# Patient Record
Sex: Male | Born: 1960 | Race: White | Hispanic: No | Smoking: Current every day smoker
Health system: Southern US, Community
[De-identification: ages and names within clinical notes are randomized; demographics above are authoritative.]

## PROBLEM LIST (undated history)

## (undated) DIAGNOSIS — Z789 Other specified health status: Secondary | ICD-10-CM

---

## 2001-12-26 ENCOUNTER — Encounter: Payer: Self-pay | Admitting: Emergency Medicine

## 2001-12-26 ENCOUNTER — Emergency Department (HOSPITAL_COMMUNITY): Admission: EM | Admit: 2001-12-26 | Discharge: 2001-12-26 | Payer: Self-pay

## 2008-01-14 ENCOUNTER — Emergency Department (HOSPITAL_COMMUNITY): Admission: EM | Admit: 2008-01-14 | Discharge: 2008-01-15 | Payer: Self-pay | Admitting: Emergency Medicine

## 2010-06-05 ENCOUNTER — Emergency Department (HOSPITAL_COMMUNITY): Admission: EM | Admit: 2010-06-05 | Discharge: 2010-06-05 | Payer: Self-pay | Admitting: Emergency Medicine

## 2010-10-26 LAB — COMPREHENSIVE METABOLIC PANEL
ALT: 17 U/L (ref 0–53)
AST: 27 U/L (ref 0–37)
Albumin: 4 g/dL (ref 3.5–5.2)
Alkaline Phosphatase: 62 U/L (ref 39–117)
BUN: 12 mg/dL (ref 6–23)
CO2: 22 mEq/L (ref 19–32)
Calcium: 9.2 mg/dL (ref 8.4–10.5)
Chloride: 105 mEq/L (ref 96–112)
Creatinine, Ser: 1.22 mg/dL (ref 0.4–1.5)
GFR calc Af Amer: >60 mL/min (ref >60)
GFR calc non Af Amer: >60 mL/min (ref >60)
Glucose, Bld: 148 mg/dL — ABNORMAL HIGH (ref 70–99)
Potassium: 4.1 mEq/L (ref 3.5–5.1)
Sodium: 138 mEq/L (ref 135–145)
Total Bilirubin: 0.9 mg/dL (ref 0.3–1.2)
Total Protein: 7.9 g/dL (ref 6.0–8.3)

## 2010-10-26 LAB — DIFFERENTIAL
Basophils Absolute: 0 10*3/uL (ref 0.0–0.1)
Basophils Relative: 0 % (ref 0–1)
Eosinophils Absolute: 0.1 10*3/uL (ref 0.0–0.7)
Eosinophils Relative: 0 % (ref 0–5)
Lymphocytes Relative: 11 % — ABNORMAL LOW (ref 12–46)
Lymphs Abs: 1.2 10*3/uL (ref 0.7–4.0)
Monocytes Absolute: 0.5 10*3/uL (ref 0.1–1.0)
Monocytes Relative: 4 % (ref 3–12)
Neutro Abs: 9.7 10*3/uL — ABNORMAL HIGH (ref 1.7–7.7)
Neutrophils Relative %: 85 % — ABNORMAL HIGH (ref 43–77)

## 2010-10-26 LAB — LIPASE, BLOOD: Lipase: 31 U/L (ref 11–59)

## 2010-10-26 LAB — CBC
HCT: 47.2 % (ref 39.0–52.0)
Hemoglobin: 16 g/dL (ref 13.0–17.0)
MCH: 30.3 pg (ref 26.0–34.0)
MCHC: 34 g/dL (ref 30.0–36.0)
MCV: 89.3 fL (ref 78.0–100.0)
Platelets: 227 10*3/uL (ref 150–400)
RBC: 5.29 MIL/uL (ref 4.22–5.81)
RDW: 14 % (ref 11.5–15.5)
WBC: 11.5 10*3/uL — ABNORMAL HIGH (ref 4.0–10.5)

## 2011-01-21 ENCOUNTER — Emergency Department (HOSPITAL_COMMUNITY)
Admission: EM | Admit: 2011-01-21 | Discharge: 2011-01-21 | Disposition: A | Discharge status: Home / Self Care | Payer: Self-pay | Attending: Emergency Medicine | Admitting: Emergency Medicine

## 2011-01-21 ENCOUNTER — Emergency Department (HOSPITAL_COMMUNITY): Payer: Self-pay

## 2011-01-21 DIAGNOSIS — R7309 Other abnormal glucose: Secondary | ICD-10-CM | POA: Insufficient documentation

## 2011-01-21 DIAGNOSIS — R6883 Chills (without fever): Secondary | ICD-10-CM | POA: Insufficient documentation

## 2011-01-21 DIAGNOSIS — R197 Diarrhea, unspecified: Secondary | ICD-10-CM | POA: Insufficient documentation

## 2011-01-21 DIAGNOSIS — R10819 Abdominal tenderness, unspecified site: Secondary | ICD-10-CM | POA: Insufficient documentation

## 2011-01-21 DIAGNOSIS — R1013 Epigastric pain: Secondary | ICD-10-CM | POA: Insufficient documentation

## 2011-01-21 DIAGNOSIS — R112 Nausea with vomiting, unspecified: Secondary | ICD-10-CM | POA: Insufficient documentation

## 2011-01-21 LAB — COMPREHENSIVE METABOLIC PANEL
ALT: 11 U/L (ref 0–53)
AST: 22 U/L (ref 0–37)
Alkaline Phosphatase: 64 U/L (ref 39–117)
CO2: 26 mEq/L (ref 19–32)
GFR calc Af Amer: >60 mL/min (ref >60)
GFR calc non Af Amer: >60 mL/min (ref >60)
Glucose, Bld: 171 mg/dL — ABNORMAL HIGH (ref 70–99)
Potassium: 4.2 mEq/L (ref 3.5–5.1)
Sodium: 139 mEq/L (ref 135–145)

## 2011-01-21 LAB — URINE MICROSCOPIC-ADD ON

## 2011-01-21 LAB — URINALYSIS, ROUTINE W REFLEX MICROSCOPIC
Bilirubin Urine: NEGATIVE
Glucose, UA: NEGATIVE mg/dL
Hgb urine dipstick: NEGATIVE
Protein, ur: 30 mg/dL — AB
Specific Gravity, Urine: 1.02 (ref 1.005–1.030)
Urobilinogen, UA: 0.2 mg/dL (ref 0.0–1.0)

## 2011-01-21 LAB — DIFFERENTIAL
Basophils Absolute: 0.1 10*3/uL (ref 0.0–0.1)
Basophils Relative: 0 % (ref 0–1)
Eosinophils Absolute: 0 10*3/uL (ref 0.0–0.7)
Neutro Abs: 11.4 10*3/uL — ABNORMAL HIGH (ref 1.7–7.7)
Neutrophils Relative %: 87 % — ABNORMAL HIGH (ref 43–77)

## 2011-01-21 LAB — CBC
Hemoglobin: 15 g/dL (ref 13.0–17.0)
Platelets: 210 10*3/uL (ref 150–400)
RBC: 4.97 MIL/uL (ref 4.22–5.81)
WBC: 13 10*3/uL — ABNORMAL HIGH (ref 4.0–10.5)

## 2011-05-11 LAB — CBC
HCT: 41.4
Hemoglobin: 13.8
RBC: 4.73
RDW: 13.4

## 2011-05-11 LAB — DIFFERENTIAL
Basophils Absolute: 0
Basophils Relative: 1
Eosinophils Absolute: 0
Neutro Abs: 4
Neutrophils Relative %: 65

## 2011-05-11 LAB — POCT CARDIAC MARKERS
Myoglobin, poc: 233
Operator id: 294511

## 2011-05-11 LAB — COMPREHENSIVE METABOLIC PANEL
ALT: 21
Alkaline Phosphatase: 44
BUN: 22
CO2: 24
GFR calc non Af Amer: 52 — ABNORMAL LOW
Glucose, Bld: 103 — ABNORMAL HIGH
Potassium: 3.1 — ABNORMAL LOW
Sodium: 132 — ABNORMAL LOW
Total Bilirubin: 0.7

## 2011-05-11 LAB — LIPASE, BLOOD: Lipase: 20

## 2011-05-11 LAB — ETHANOL: Alcohol, Ethyl (B): <5

## 2012-08-14 HISTORY — PX: CHEST TUBE INSERTION: SHX231

## 2013-01-26 ENCOUNTER — Inpatient Hospital Stay (HOSPITAL_COMMUNITY)
Admission: EM | Admit: 2013-01-26 | Discharge: 2013-01-30 | DRG: 200 | Disposition: A | Discharge status: Home / Self Care | Payer: MEDICAID | Attending: General Surgery | Admitting: General Surgery

## 2013-01-26 ENCOUNTER — Observation Stay (HOSPITAL_COMMUNITY): Payer: Self-pay

## 2013-01-26 ENCOUNTER — Emergency Department (HOSPITAL_COMMUNITY): Payer: Self-pay

## 2013-01-26 ENCOUNTER — Encounter (HOSPITAL_COMMUNITY): Payer: Self-pay | Admitting: Emergency Medicine

## 2013-01-26 DIAGNOSIS — S2242XA Multiple fractures of ribs, left side, initial encounter for closed fracture: Secondary | ICD-10-CM

## 2013-01-26 DIAGNOSIS — S270XXA Traumatic pneumothorax, initial encounter: Secondary | ICD-10-CM

## 2013-01-26 DIAGNOSIS — J939 Pneumothorax, unspecified: Secondary | ICD-10-CM

## 2013-01-26 DIAGNOSIS — S2249XA Multiple fractures of ribs, unspecified side, initial encounter for closed fracture: Secondary | ICD-10-CM | POA: Diagnosis present

## 2013-01-26 DIAGNOSIS — S42109A Fracture of unspecified part of scapula, unspecified shoulder, initial encounter for closed fracture: Secondary | ICD-10-CM | POA: Diagnosis present

## 2013-01-26 DIAGNOSIS — IMO0002 Reserved for concepts with insufficient information to code with codable children: Secondary | ICD-10-CM | POA: Diagnosis present

## 2013-01-26 DIAGNOSIS — J95811 Postprocedural pneumothorax: Principal | ICD-10-CM | POA: Diagnosis present

## 2013-01-26 DIAGNOSIS — F1020 Alcohol dependence, uncomplicated: Secondary | ICD-10-CM | POA: Diagnosis present

## 2013-01-26 DIAGNOSIS — S2232XA Fracture of one rib, left side, initial encounter for closed fracture: Secondary | ICD-10-CM

## 2013-01-26 DIAGNOSIS — T07XXXA Unspecified multiple injuries, initial encounter: Secondary | ICD-10-CM | POA: Diagnosis present

## 2013-01-26 DIAGNOSIS — S42112A Displaced fracture of body of scapula, left shoulder, initial encounter for closed fracture: Secondary | ICD-10-CM

## 2013-01-26 DIAGNOSIS — T148XXA Other injury of unspecified body region, initial encounter: Secondary | ICD-10-CM

## 2013-01-26 LAB — BASIC METABOLIC PANEL
CO2: 24 mEq/L (ref 19–32)
Calcium: 8.9 mg/dL (ref 8.4–10.5)
Creatinine, Ser: 0.96 mg/dL (ref 0.50–1.35)
GFR calc non Af Amer: >90 mL/min (ref >90)
Glucose, Bld: 122 mg/dL — ABNORMAL HIGH (ref 70–99)

## 2013-01-26 LAB — URINALYSIS, ROUTINE W REFLEX MICROSCOPIC
Glucose, UA: NEGATIVE mg/dL
Ketones, ur: NEGATIVE mg/dL
Protein, ur: NEGATIVE mg/dL
Urobilinogen, UA: 0.2 mg/dL (ref 0.0–1.0)

## 2013-01-26 LAB — CBC WITH DIFFERENTIAL/PLATELET
Basophils Absolute: 0.1 10*3/uL (ref 0.0–0.1)
Eosinophils Absolute: 0.9 10*3/uL — ABNORMAL HIGH (ref 0.0–0.7)
Eosinophils Relative: 4 % (ref 0–5)
HCT: 44.8 % (ref 39.0–52.0)
Lymphocytes Relative: 13 % (ref 12–46)
MCH: 30.6 pg (ref 26.0–34.0)
MCV: 88.4 fL (ref 78.0–100.0)
Monocytes Absolute: 1.2 10*3/uL — ABNORMAL HIGH (ref 0.1–1.0)
Platelets: 251 10*3/uL (ref 150–400)
RDW: 13.5 % (ref 11.5–15.5)
WBC: 23.9 10*3/uL — ABNORMAL HIGH (ref 4.0–10.5)

## 2013-01-26 MED ORDER — MORPHINE SULFATE 2 MG/ML IJ SOLN
INTRAMUSCULAR | Status: AC
  Administered 2013-01-26: 2 mg via INTRAMUSCULAR
  Filled 2013-01-26: qty 1

## 2013-01-26 MED ORDER — ONDANSETRON HCL 4 MG PO TABS: 4.0000 mg | ORAL_TABLET | Freq: Four times a day (QID) | ORAL | Status: DC | PRN

## 2013-01-26 MED ORDER — LORAZEPAM 1 MG PO TABS
1.0000 mg | ORAL_TABLET | Freq: Four times a day (QID) | ORAL | Status: AC | PRN
  Administered 2013-01-27 – 2013-01-29 (×3): 1 mg via ORAL
  Filled 2013-01-26 (×3): qty 1

## 2013-01-26 MED ORDER — FOLIC ACID 1 MG PO TABS
1.0000 mg | ORAL_TABLET | Freq: Every day | ORAL | Status: DC
  Administered 2013-01-26 – 2013-01-30 (×5): 1 mg via ORAL
  Filled 2013-01-26 (×5): qty 1

## 2013-01-26 MED ORDER — SODIUM CHLORIDE 0.9 % IV SOLN
INTRAVENOUS | Status: DC
  Administered 2013-01-26: 16:00:00 via INTRAVENOUS

## 2013-01-26 MED ORDER — LORAZEPAM 2 MG/ML IJ SOLN
1.0000 mg | Freq: Four times a day (QID) | INTRAMUSCULAR | Status: AC | PRN
  Administered 2013-01-26: 1 mg via INTRAVENOUS
  Filled 2013-01-26: qty 1

## 2013-01-26 MED ORDER — KCL IN DEXTROSE-NACL 20-5-0.45 MEQ/L-%-% IV SOLN
INTRAVENOUS | Status: DC
  Administered 2013-01-26: 23:00:00 via INTRAVENOUS
  Filled 2013-01-26 (×3): qty 1000

## 2013-01-26 MED ORDER — MORPHINE SULFATE 4 MG/ML IJ SOLN
4.0000 mg | Freq: Once | INTRAMUSCULAR | Status: AC
  Administered 2013-01-26: 4 mg via INTRAVENOUS
  Filled 2013-01-26: qty 1

## 2013-01-26 MED ORDER — MIDAZOLAM HCL 2 MG/2ML IJ SOLN
2.0000 mg | Freq: Once | INTRAMUSCULAR | Status: AC
  Administered 2013-01-26: 2 mg via INTRAVENOUS

## 2013-01-26 MED ORDER — ONDANSETRON HCL 4 MG/2ML IJ SOLN
4.0000 mg | Freq: Once | INTRAMUSCULAR | Status: AC
  Administered 2013-01-26: 4 mg via INTRAVENOUS
  Filled 2013-01-26: qty 2

## 2013-01-26 MED ORDER — VITAMIN B-1 100 MG PO TABS
100.0000 mg | ORAL_TABLET | Freq: Every day | ORAL | Status: DC
  Administered 2013-01-26 – 2013-01-30 (×5): 100 mg via ORAL
  Filled 2013-01-26 (×5): qty 1

## 2013-01-26 MED ORDER — FENTANYL CITRATE 0.05 MG/ML IJ SOLN
INTRAMUSCULAR | Status: AC | PRN
  Administered 2013-01-26: 25 ug via INTRAVENOUS

## 2013-01-26 MED ORDER — IOHEXOL 300 MG/ML  SOLN
100.0000 mL | Freq: Once | INTRAMUSCULAR | Status: AC | PRN
  Administered 2013-01-26: 100 mL via INTRAVENOUS

## 2013-01-26 MED ORDER — PANTOPRAZOLE SODIUM 40 MG PO TBEC: 40.0000 mg | DELAYED_RELEASE_TABLET | Freq: Every day | ORAL | Status: DC

## 2013-01-26 MED ORDER — THIAMINE HCL 100 MG/ML IJ SOLN
100.0000 mg | Freq: Every day | INTRAMUSCULAR | Status: DC
  Filled 2013-01-26 (×2): qty 1

## 2013-01-26 MED ORDER — PANTOPRAZOLE SODIUM 40 MG IV SOLR
40.0000 mg | Freq: Every day | INTRAVENOUS | Status: DC
  Administered 2013-01-26: 40 mg via INTRAVENOUS
  Filled 2013-01-26 (×2): qty 40

## 2013-01-26 MED ORDER — HEPARIN SODIUM (PORCINE) 5000 UNIT/ML IJ SOLN
5000.0000 [IU] | Freq: Three times a day (TID) | INTRAMUSCULAR | Status: DC
  Administered 2013-01-26 – 2013-01-27 (×2): 5000 [IU] via SUBCUTANEOUS
  Filled 2013-01-26 (×5): qty 1

## 2013-01-26 MED ORDER — FENTANYL CITRATE 0.05 MG/ML IJ SOLN
50.0000 ug | Freq: Once | INTRAMUSCULAR | Status: AC
  Administered 2013-01-26: 50 ug via INTRAVENOUS

## 2013-01-26 MED ORDER — HYDROCODONE-ACETAMINOPHEN 5-325 MG PO TABS
1.0000 | ORAL_TABLET | ORAL | Status: DC | PRN
  Administered 2013-01-26 – 2013-01-27 (×2): 1 via ORAL
  Filled 2013-01-26 (×2): qty 1

## 2013-01-26 MED ORDER — ADULT MULTIVITAMIN W/MINERALS CH
1.0000 | ORAL_TABLET | Freq: Every day | ORAL | Status: DC
  Administered 2013-01-26 – 2013-01-30 (×5): 1 via ORAL
  Filled 2013-01-26 (×5): qty 1

## 2013-01-26 MED ORDER — MIDAZOLAM HCL 2 MG/2ML IJ SOLN
INTRAMUSCULAR | Status: AC
  Filled 2013-01-26: qty 6

## 2013-01-26 MED ORDER — FENTANYL CITRATE 0.05 MG/ML IJ SOLN
INTRAMUSCULAR | Status: AC
  Filled 2013-01-26: qty 2

## 2013-01-26 MED ORDER — ONDANSETRON HCL 4 MG/2ML IJ SOLN: 4.0000 mg | Freq: Four times a day (QID) | INTRAMUSCULAR | Status: DC | PRN

## 2013-01-26 MED ORDER — MORPHINE SULFATE 2 MG/ML IJ SOLN
1.0000 mg | INTRAMUSCULAR | Status: DC | PRN
  Administered 2013-01-26 – 2013-01-27 (×2): 1 mg via INTRAVENOUS
  Filled 2013-01-26: qty 1

## 2013-01-26 NOTE — ED Notes (Addendum)
[place] MD at bedside.

## 2013-01-26 NOTE — ED Notes (Signed)
Pt. Unable to urinate at this time. Nurse was notified.   

## 2013-01-26 NOTE — Procedures (Signed)
Chest Tube Insertion Procedure Note  Indications:  Clinically significant Pneumothorax  Pre-operative Diagnosis: Pneumothorax  Post-operative Diagnosis: Pneumothorax  Procedure Details  Informed consent was obtained for the procedure, including sedation.  Risks of lung perforation, hemorrhage, arrhythmia, and adverse drug reaction were discussed.   After sterile skin prep, using standard technique, a 32 French tube was placed in the left lateral  rib space.  Findings: Rush of air and small amount blood obtained upon entry  Estimated Blood Loss:  Minimal         Specimens:  None              Complications:  None; patient tolerated the procedure well.         Disposition: floor         Condition: stable  Attending Attestation: I performed the procedure.
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
## 2013-01-26 NOTE — ED Provider Notes (Signed)
Medical screening examination/treatment/procedure(s) were conducted as a shared visit with non-physician practitioner(s) and myself.  I personally evaluated the patient during the encounter  Doug Sou, MD 01/26/13 2142
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
## 2013-01-27 ENCOUNTER — Inpatient Hospital Stay (HOSPITAL_COMMUNITY): Payer: MEDICAID

## 2013-01-27 ENCOUNTER — Observation Stay (HOSPITAL_COMMUNITY): Payer: Self-pay

## 2013-01-27 DIAGNOSIS — S270XXA Traumatic pneumothorax, initial encounter: Secondary | ICD-10-CM

## 2013-01-27 DIAGNOSIS — S2242XA Multiple fractures of ribs, left side, initial encounter for closed fracture: Secondary | ICD-10-CM

## 2013-01-27 LAB — COMPREHENSIVE METABOLIC PANEL
ALT: 19 U/L (ref 0–53)
AST: 53 U/L — ABNORMAL HIGH (ref 0–37)
Albumin: 3 g/dL — ABNORMAL LOW (ref 3.5–5.2)
Alkaline Phosphatase: 61 U/L (ref 39–117)
CO2: 25 mEq/L (ref 19–32)
Chloride: 102 mEq/L (ref 96–112)
GFR calc non Af Amer: >90 mL/min (ref >90)
Potassium: 4.4 mEq/L (ref 3.5–5.1)
Sodium: 136 mEq/L (ref 135–145)
Total Bilirubin: 0.5 mg/dL (ref 0.3–1.2)

## 2013-01-27 LAB — CBC
Hemoglobin: 13.6 g/dL (ref 13.0–17.0)
MCH: 29.8 pg (ref 26.0–34.0)
MCV: 89.7 fL (ref 78.0–100.0)
MCV: 90 fL (ref 78.0–100.0)
Platelets: 209 10*3/uL (ref 150–400)
Platelets: 210 10*3/uL (ref 150–400)
RBC: 4.52 MIL/uL (ref 4.22–5.81)
RBC: 4.56 MIL/uL (ref 4.22–5.81)
RDW: 13.9 % (ref 11.5–15.5)
WBC: 11.6 10*3/uL — ABNORMAL HIGH (ref 4.0–10.5)
WBC: 16.4 10*3/uL — ABNORMAL HIGH (ref 4.0–10.5)

## 2013-01-27 LAB — CREATININE, SERUM
Creatinine, Ser: 0.88 mg/dL (ref 0.50–1.35)
GFR calc Af Amer: >90 mL/min (ref >90)

## 2013-01-27 MED ORDER — OXYCODONE HCL 5 MG PO TABS
10.0000 mg | ORAL_TABLET | ORAL | Status: DC | PRN
  Administered 2013-01-27 (×3): 20 mg via ORAL
  Administered 2013-01-27: 15 mg via ORAL
  Administered 2013-01-28 – 2013-01-29 (×5): 20 mg via ORAL
  Administered 2013-01-29: 10 mg via ORAL
  Administered 2013-01-30 (×2): 15 mg via ORAL
  Filled 2013-01-27: qty 3
  Filled 2013-01-27 (×4): qty 4
  Filled 2013-01-27: qty 3
  Filled 2013-01-27 (×2): qty 4
  Filled 2013-01-27: qty 2
  Filled 2013-01-27 (×2): qty 4
  Filled 2013-01-27: qty 3

## 2013-01-27 MED ORDER — ENOXAPARIN SODIUM 40 MG/0.4ML ~~LOC~~ SOLN
40.0000 mg | SUBCUTANEOUS | Status: DC
  Administered 2013-01-27 – 2013-01-30 (×4): 40 mg via SUBCUTANEOUS
  Filled 2013-01-27 (×6): qty 0.4

## 2013-01-27 MED ORDER — MORPHINE SULFATE 2 MG/ML IJ SOLN
2.0000 mg | INTRAMUSCULAR | Status: DC | PRN
  Administered 2013-01-27 – 2013-01-30 (×10): 2 mg via INTRAVENOUS
  Filled 2013-01-27 (×10): qty 1

## 2013-01-27 MED ORDER — BACITRACIN ZINC 500 UNIT/GM EX OINT
TOPICAL_OINTMENT | Freq: Two times a day (BID) | CUTANEOUS | Status: DC
  Administered 2013-01-27 – 2013-01-30 (×7): via TOPICAL
  Filled 2013-01-27: qty 15

## 2013-01-27 NOTE — Progress Notes (Addendum)
CXR results reported to Dr. Derrell Lolling.  Ok to change dressing around chest tube site. Will follow up with patient bedside if continues to bleed.  Nursing continue to monitor.

## 2013-01-27 NOTE — Progress Notes (Signed)
Orthopedic Tech Progress Note Patient Details:  Aaron Daniel [birth year] [id number] Applied arm sling to LUE. Ortho Devices Type of Ortho Device: Arm sling Ortho Device/Splint Location: LUE Ortho Device/Splint Interventions: Application   Lesle Chris 01/27/2013, 8:55 AM

## 2013-01-27 NOTE — Progress Notes (Signed)
Patient having some bloody drainage from chest tube site.  Dressing re-inforced, but family still concerned.  Vitals are stable, no signs of acute distress. Spoke with Dr. Derrell Lolling on call for Trauma, new order for a portable CXR.  CXR completed, awaiting results, will follow up with MD when results are in.
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
## 2013-01-27 NOTE — Progress Notes (Signed)
UR completed 

## 2013-01-27 NOTE — Progress Notes (Signed)
Patient ID: Aaron Daniel, male   DOB: 03-22-1961, 52 y.o.   MRN: [id number]   LOS: 1 day   Subjective: Taciturn but FC willingly. Pain control so-so.   Objective: Vital signs in last 24 hours: Temp:  [98.4 F (36.9 C)-99.3 F (37.4 C)] 98.6 F (37 C) (06/16 0634) Pulse Rate:  [92-114] 97 (06/16 0634) Resp:  [16-28] 18 (06/16 0634) BP: (112-155)/(45-93) 133/77 mmHg (06/16 0634) SpO2:  [96 %-99 %] 98 % (06/16 0634) Weight:  [150 lb 12.8 oz (68.402 kg)] 150 lb 12.8 oz (68.402 kg) (06/16 0634)    CT No air leak No output   Laboratory  CBC  Recent Labs  01/26/13 2355 01/27/13 0525  WBC 16.4* 11.6*  HGB 13.6 13.4  HCT 40.9 40.7  PLT 210 209   BMET  Recent Labs  01/26/13 1602 01/26/13 2355 01/27/13 0525  NA 139  --  136  K 4.1  --  4.4  CL 102  --  102  CO2 24  --  25  GLUCOSE 122*  --  111*  BUN 10  --  11  CREATININE 0.96 0.88 0.93  CALCIUM 8.9  --  8.1*    Radiology Results PORTABLE CHEST - 1 VIEW  Comparison: January 26, 2013.  Findings: Cardiomediastinal silhouette appears normal. Right lung  remains clear. Left-sided chest tube remains with tip projected  over the left hilar region and unchanged in position. Subcutaneous  emphysema is seen over left lateral chest wall and left  supraclavicular region which is unchanged. Left apical  pneumothorax is again noted and not significantly changed compared  to prior exam.  IMPRESSION:  No significant change involving left-sided pneumothorax compared to  prior exam.  Original Report Authenticated By: Lupita Raider., M.D.   Physical Exam General appearance: alert and no distress Resp: diminished breath sounds anterior - left Cardio: Mild tachycardia GI: normal findings: bowel sounds normal and soft, non-tender   Assessment/Plan: [facility] Multiple left rib fxs w/PTX s/p CT -- Increase suction to 30cm Left scapula fx -- Ortho consult, NWB, sling Multiple abrasions -- Local care EtOH -- CIWA FEN -- Change  oral pain meds to oxycodone, advance diet, SL IV VTE -- SCD's, heparin (will change to Lovenox) Dispo -- CT, PT/OT    Freeman Caldron, PA-C Pager: [phone] General Trauma PA Pager: [phone]   01/27/2013

## 2013-01-28 ENCOUNTER — Inpatient Hospital Stay (HOSPITAL_COMMUNITY): Payer: MEDICAID

## 2013-01-28 ENCOUNTER — Encounter (HOSPITAL_COMMUNITY): Payer: Self-pay | Admitting: *Deleted

## 2013-01-28 MED ORDER — TRAMADOL HCL 50 MG PO TABS
100.0000 mg | ORAL_TABLET | Freq: Four times a day (QID) | ORAL | Status: DC
  Administered 2013-01-28 – 2013-01-30 (×9): 100 mg via ORAL
  Filled 2013-01-28 (×9): qty 2

## 2013-01-28 NOTE — Clinical Social Work Note (Addendum)
Clinical Social Worker completed SBIRT with patient. Patient was agreeable to have family present during assessment. Patient was agreeable to receiving resources for substance abuse treatment facilities, AA meetings and outpatient mental health providers. Patient reported that he did not have a problem with drinking. Patient reported that his main focus is finding $200 to complete his court ordered program. CSW will sign off, as social work intervention is no longer needed.   Rozetta Nunnery MSW, [employer]  [phone]

## 2013-01-28 NOTE — Progress Notes (Signed)
Physical Therapy Treatment Patient Details Name: Aaron Daniel MRN: [id number] DOB: 02-16-1961 Today's Date: 01/28/2013 Time: 4132-4401 PT Time Calculation (min): 17 min  PT Assessment / Plan / Recommendation Comments on Treatment Session  Pt is progressing well with therapy. Will plan on next session to address steps. Pt minguard for all mobility due to lightheadedness. Pt reports he will have (A) when D/C by mother or family.     Follow Up Recommendations  Supervision - Intermittent;Supervision for mobility/OOB     Does the patient have the potential to tolerate intense rehabilitation     Barriers to Discharge        Equipment Recommendations  None recommended by PT    Recommendations for Other Services    Frequency Min 4X/week   Plan Discharge plan remains appropriate;Frequency remains appropriate    Precautions / Restrictions Precautions Precautions: Fall;Other (comment) (chest tube) Required Braces or Orthoses: Other Brace/Splint Other Brace/Splint: sling L UE Restrictions Weight Bearing Restrictions: Yes RUE Weight Bearing: Non weight bearing LUE Weight Bearing: Non weight bearing   Pertinent Vitals/Pain 9/10; denies need for pain medicine. Pain primarily in ribs.     Mobility  Bed Mobility Bed Mobility: Not assessed (pt sitting EOB) Details for Bed Mobility Assistance: at EOB Transfers Transfers: Sit to Stand;Stand to Sit Sit to Stand: 4: Min guard;From bed;From toilet Stand to Sit: 4: Min guard;To chair/3-in-1;To toilet Details for Transfer Assistance: min cues for safety; min guard to steady secondary to "lightheadedness"  Ambulation/Gait Ambulation/Gait Assistance: 4: Min guard Ambulation Distance (Feet): 200 Feet Assistive device: Other (Comment) (IV pole supporting R UE) Ambulation/Gait Assistance Details: pt c/o lightheadedness; required minguard for safety; pt amb with narrow BOS and sways no LOB noted  Gait Pattern: Step-through pattern;Narrow base of  support Gait velocity: decreased due to pain in ribs and lightheadedness Stairs: No Wheelchair Mobility Wheelchair Mobility: No    Exercises     PT Diagnosis:    PT Problem List:   PT Treatment Interventions:     PT Goals Acute Rehab PT Goals PT Goal Formulation: With patient Time For Goal Achievement: 02/03/13 Potential to Achieve Goals: Good PT Goal: Sit to Stand - Progress: Progressing toward goal PT Goal: Stand to Sit - Progress: Progressing toward goal PT Goal: Ambulate - Progress: Progressing toward goal  Visit Information  Last PT Received On: 01/28/13 Assistance Needed: +1 PT/OT Co-Evaluation/Treatment: Yes    Subjective Data  Subjective: pt sitting EOB; eager to walk  Patient Stated Goal: home by wednesday    Cognition  Cognition Arousal/Alertness: Awake/alert Behavior During Therapy: WFL for tasks assessed/performed Overall Cognitive Status: Within Functional Limits for tasks assessed    Balance  Balance Balance Assessed: Yes Static Sitting Balance Static Sitting - Balance Support: Right upper extremity supported;Feet supported Static Sitting - Level of Assistance: 6: Modified independent (Device/Increase time) Static Standing Balance Static Standing - Balance Support: Right upper extremity supported;During functional activity Static Standing - Level of Assistance: 5: Stand by assistance  End of Session PT - End of Session Equipment Utilized During Treatment: Gait belt;Other (comment) (chest tube and L UE sling) Activity Tolerance: Patient tolerated treatment well Patient left: in chair;with call bell/phone within reach Nurse Communication: Mobility status   GP     Donell Sievert, [place] [phone] 01/28/2013, 11:14 AM

## 2013-01-28 NOTE — Progress Notes (Signed)
Patient ID: Aaron Daniel, male   DOB: 1961/04/13, 52 y.o.   MRN: [id number]   LOS: 2 days   Subjective: Feeling better.   Objective: Vital signs in last 24 hours: Temp:  [98.7 F (37.1 C)-100.7 F (38.2 C)] 100.7 F (38.2 C) (06/17 0530) Pulse Rate:  [94-110] 94 (06/17 0530) Resp:  [18] 18 (06/17 0530) BP: (121-130)/(70-82) 121/74 mmHg (06/17 0530) SpO2:  [95 %-99 %] 99 % (06/17 0530) Last BM Date: 01/26/13   CT No air leak 23ml/24h @10ml    Radiology Results PORTABLE CHEST - 1 VIEW  Comparison: 01/27/2013  Findings: Left chest tube remains in place, unchanged. Stable  small left apical pneumothorax and subcutaneous emphysema. Left  lower lobe atelectasis is stable. Slight increased right basilar  atelectasis with decreasing lung volumes.  IMPRESSION:  Stable left apical pneumothorax.  Bibasilar atelectasis, slightly increased on the right. Decreasing  lung volumes.  Original Report Authenticated By: Charlett Nose, M.D.  I actually think PTX has improved from film yesterday evening.   Physical Exam General appearance: alert and no distress Resp: clear to auscultation bilaterally Cardio: regular rate and rhythm GI: normal findings: bowel sounds normal and soft, non-tender   Assessment/Plan: [facility]  Multiple left rib fxs w/PTX s/p CT -- Increase suction to 40cm  Left scapula fx -- Ortho consult, NWB, sling  Multiple abrasions -- Local care  EtOH -- CIWA  FEN -- Still needing frequent breakthrough meds, will add scheduled tramadol VTE -- SCD's, Lovenox Dispo -- CT, PT/OT    Freeman Caldron, PA-C Pager: [phone] General Trauma PA Pager: [phone]   01/28/2013

## 2013-01-28 NOTE — Progress Notes (Signed)
Occupational Therapy Treatment Patient Details Name: Aaron Daniel MRN: [id number] DOB: 11/05/1960 Today's Date: 01/28/2013 Time: 4696-2952 OT Time Calculation (min): 15 min  OT Assessment / Plan / Recommendation Comments on Treatment Session Pt tolerated session though has 9/10 pain currently. Will need to educate family on sling wear and how to don/doff for ADL. No family present today.    Follow Up Recommendations  Supervision - Intermittent    Barriers to Discharge       Equipment Recommendations  None recommended by OT    Recommendations for Other Services    Frequency Min 2X/week   Plan Discharge plan remains appropriate    Precautions / Restrictions Precautions Precautions: Fall;Other (comment) (chest tube) Required Braces or Orthoses: Other Brace/Splint Other Brace/Splint: sling L UE Restrictions Weight Bearing Restrictions: Yes RUE Weight Bearing: Non weight bearing LUE Weight Bearing: Non weight bearing        ADL  Toilet Transfer: Performed;Min guard Toilet Transfer Method: Other (comment) (no device. didnt need to hold for support R UE) Toilet Transfer Equipment: Comfort height toilet ADL Comments: Up in room with PT/OT and then in hallway for walk with PT. See PT notes. Pt min guard for toilet transfer. States he feels alittle weak and light headed but declines need to stop and rest-able to complete session. States family can help wtih meals, etc at d/c.     OT Diagnosis:    OT Problem List:   OT Treatment Interventions:     OT Goals Acute Rehab OT Goals OT Goal Formulation: With patient Time For Goal Achievement: 02/03/13 Potential to Achieve Goals: Good ADL Goals Pt Will Perform Upper Body Bathing: with modified independence;Sitting, chair Pt Will Perform Lower Body Bathing: with modified independence;Sit to stand from chair Pt Will Perform Upper Body Dressing: with modified independence;Sitting, chair;Sitting, bed Pt Will Perform Lower Body Dressing:  with modified independence;Sit to stand from bed;Sit to stand from chair Pt Will Transfer to Toilet: with modified independence;Ambulation ADL Goal: Toilet Transfer - Progress: Progressing toward goals Pt Will Perform Toileting - Clothing Manipulation: with modified independence;Standing Pt Will Perform Toileting - Hygiene: with modified independence;Sit to stand from 3-in-1/toilet;Sitting on 3-in-1 or toilet Pt Will Perform Tub/Shower Transfer: Shower transfer;Tub transfer;with supervision;Ambulation Miscellaneous OT Goals Miscellaneous OT Goal #1: Pt will be able to independently direct caregiver on donning/doffing sling while maintaining precautions.  Visit Information  Last OT Received On: 01/28/13 Assistance Needed: +1 PT/OT Co-Evaluation/Treatment: Yes    Subjective Data  Subjective: i want to walk some Patient Stated Goal: as above   Prior Functioning       Cognition  Cognition Arousal/Alertness: Awake/alert Behavior During Therapy: WFL for tasks assessed/performed Overall Cognitive Status: Within Functional Limits for tasks assessed    Mobility  Bed Mobility Bed Mobility: Not assessed (pt sitting EOB) Details for Bed Mobility Assistance: at EOB Transfers Transfers: Sit to Stand;Stand to Sit Sit to Stand: 4: Min guard;From bed;From toilet Stand to Sit: 4: Min guard;To chair/3-in-1;To toilet Details for Transfer Assistance: min cues for safety; min guard to steady secondary to "lightheadedness"     Exercises      Balance Balance Balance Assessed: Yes Static Sitting Balance Static Sitting - Balance Support: Right upper extremity supported;Feet supported Static Sitting - Level of Assistance: 6: Modified independent (Device/Increase time) Static Standing Balance Static Standing - Balance Support: Right upper extremity supported;During functional activity Static Standing - Level of Assistance: 5: Stand by assistance   End of Session OT - End of Session  Activity  Tolerance: Patient tolerated treatment well Patient left: in chair;with call bell/phone within reach  GO     Lennox Laity [phone] 01/28/2013, 11:16 AM

## 2013-01-28 NOTE — Progress Notes (Signed)
Did dressing change at 2100 on 6/16.  I have been monitoring dressing. Scant amount of bleeding. Patient is lying in bed and is more comfortable in this position compared to sitting up in recliner.

## 2013-01-28 NOTE — Progress Notes (Signed)
Asked by unit director to see pt about financial issues.  Pt was already scheduled to be seen by Brandi with Financial Counseling today at 1500 but I stopped by his room at 1400 to speak with him.  He confirmed that he has no insurance at this time. He qualifies for the [facility] program for medication assistance at discharge and stated that he would be able to make the $3 copay for medications. Medical team notified.  Will complete the MATCH enrollment once medications and discharge date are known.

## 2013-01-28 NOTE — Progress Notes (Signed)
Up to -40cm on CT.  Getting up to ambulate.  CXR in AM. Patient examined and I agree with the assessment and plan  Violeta Gelinas, MD, MPH, FACS Pager: [phone]  01/28/2013 10:45 AM

## 2013-01-29 ENCOUNTER — Inpatient Hospital Stay (HOSPITAL_COMMUNITY): Payer: MEDICAID

## 2013-01-29 MED ORDER — BISACODYL 10 MG RE SUPP: 10.0000 mg | Freq: Every day | RECTAL | Status: DC | PRN

## 2013-01-29 MED ORDER — ALBUTEROL SULFATE HFA 108 (90 BASE) MCG/ACT IN AERS
1.0000 | INHALATION_SPRAY | Freq: Four times a day (QID) | RESPIRATORY_TRACT | Status: DC | PRN
  Filled 2013-01-29: qty 6.7

## 2013-01-29 MED ORDER — DOCUSATE SODIUM 100 MG PO CAPS
100.0000 mg | ORAL_CAPSULE | Freq: Two times a day (BID) | ORAL | Status: DC
  Administered 2013-01-29 – 2013-01-30 (×3): 100 mg via ORAL
  Filled 2013-01-29 (×3): qty 1

## 2013-01-29 MED ORDER — POLYETHYLENE GLYCOL 3350 17 G PO PACK
17.0000 g | PACK | Freq: Once | ORAL | Status: AC
  Administered 2013-01-29: 17 g via ORAL
  Filled 2013-01-29 (×2): qty 1

## 2013-01-29 NOTE — Progress Notes (Addendum)
Occupational Therapy Treatment Patient Details Name: Aaron Daniel MRN: [id number] DOB: August 11, 1961 Today's Date: 01/29/2013 Time: 7829-5621 OT Time Calculation (min): 18 min  OT Assessment / Plan / Recommendation Comments on Treatment Session Pt tolerated session well and was able to return demo bathing/dressing techniques. Pt's mother also provided with sling education    Follow Up Recommendations  Supervision - Intermittent    Barriers to Discharge   none    Equipment Recommendations  None recommended by OT    Recommendations for Other Services  Progress rehab of shoulder per MD  Frequency Min 2X/week   Plan Discharge plan remains appropriate    Precautions / Restrictions Precautions Precautions: Fall;Shoulder Type of Shoulder Precautions: Pt provided with sling education/protocol Precaution Booklet Issued: Yes (comment) Required Braces or Orthoses: Other Brace/Splint Other Brace/Splint: sling L UE Restrictions Weight Bearing Restrictions: Yes LUE Weight Bearing: Non weight bearing   Pertinent Vitals/Pain 4/10 L UE    ADL  ADL Comments: pt and his mother provided with sling education: donning/doffing, position for sleeping, bathuing/dressing techniques. Provided with handout    OT Diagnosis:    OT Problem List:   OT Treatment Interventions:     OT Goals Miscellaneous OT Goals OT Goal: Miscellaneous Goal #1 - Progress: Progressing toward goals  Visit Information  Last OT Received On: 01/29/13 Assistance Needed: +1    Subjective Data  Subjective: " I am getting better " Patient Stated Goal: To return home   Prior Functioning       Cognition  Cognition Arousal/Alertness: Awake/alert Behavior During Therapy: WFL for tasks assessed/performed Overall Cognitive Status: Within Functional Limits for tasks assessed    Mobility  Bed Mobility Bed Mobility: Not assessed Transfers Transfers: Not assessed    Exercises      Balance Balance Balance Assessed:  No   End of Session OT - End of Session Activity Tolerance: Patient tolerated treatment well Patient left: in chair;with call bell/phone within reach;with family/visitor present  GO     Galen Manila 01/29/2013, 1:15 PM

## 2013-01-29 NOTE — Progress Notes (Signed)
Physical Therapy Treatment Patient Details Name: Aaron Daniel MRN: [id number] DOB: 15-Jun-1961 Today's Date: 01/29/2013 Time: 1191-4782 PT Time Calculation (min): 24 min  PT Assessment / Plan / Recommendation Comments on Treatment Session  Patient progressing well. Able to tolerate stair without increase pain. Educated for safe sling wear and safety with mobility    Follow Up Recommendations  Supervision - Intermittent;Supervision for mobility/OOB     Does the patient have the potential to tolerate intense rehabilitation     Barriers to Discharge        Equipment Recommendations  None recommended by PT    Recommendations for Other Services    Frequency Min 4X/week   Plan Discharge plan remains appropriate;Frequency remains appropriate    Precautions / Restrictions Precautions Precautions: Fall;Other (comment) Required Braces or Orthoses: Other Brace/Splint Other Brace/Splint: sling L UE Restrictions LUE Weight Bearing: Non weight bearing   Pertinent Vitals/Pain     Mobility  Bed Mobility Supine to Sit: 5: Supervision Transfers Sit to Stand: 4: Min guard;From bed Stand to Sit: 5: Supervision;To chair/3-in-1 Details for Transfer Assistance: MinGuard for safety as patient a little unsteady with stand Ambulation/Gait Ambulation/Gait Assistance: 4: Min guard Ambulation Distance (Feet): 400 Feet Assistive device: Other (Comment) (IV pole) Ambulation/Gait Assistance Details: MinGuard for safety as patient slightly unsteady but no LOB. Patient increased distance very well and he is very motivated Gait Pattern: Step-through pattern;Narrow base of support Gait velocity: decreased due to pain in ribs  Stairs: Yes Stairs Assistance: 4: Min guard Stair Management Technique: Step to pattern;Forwards;One rail Right Number of Stairs: 5    Exercises     PT Diagnosis:    PT Problem List:   PT Treatment Interventions:     PT Goals Acute Rehab PT Goals PT Goal: Supine/Side to  Sit - Progress: Progressing toward goal PT Goal: Sit to Stand - Progress: Progressing toward goal PT Goal: Stand to Sit - Progress: Progressing toward goal PT Goal: Ambulate - Progress: Progressing toward goal PT Goal: Up/Down Stairs - Progress: Progressing toward goal  Visit Information  Last PT Received On: 01/29/13 Assistance Needed: +1    Subjective Data      Cognition  Cognition Arousal/Alertness: Awake/alert Behavior During Therapy: WFL for tasks assessed/performed Overall Cognitive Status: Within Functional Limits for tasks assessed    Balance     End of Session PT - End of Session Equipment Utilized During Treatment: Gait belt;Other (comment) Activity Tolerance: Patient tolerated treatment well Patient left: in chair;with call bell/phone within reach Nurse Communication: Mobility status   GP     Fredrich Birks 01/29/2013, 11:51 AM  01/29/2013 Fredrich Birks PTA [phone] pager [phone] office

## 2013-01-29 NOTE — Progress Notes (Signed)
Patient ID: Aaron Daniel, male   DOB: 24-Apr-1961, 52 y.o.   MRN: [id number] In  Sling. Scapula pain better pain 8 out of 10.   Still has chest tube. Discussed office followup 1 to 2 wks after discharge and starting shoulder rehab program at that time.  No other reported areas from the patient of pain.

## 2013-01-29 NOTE — Progress Notes (Signed)
Subjective: Pain right side, he says trying not to take meds, no real bm or much flatus  Objective: Vital signs in last 24 hours: Temp:  [98.4 F (36.9 C)-99.5 F (37.5 C)] 98.5 F (36.9 C) (06/18 0533) Pulse Rate:  [88-98] 94 (06/18 0533) Resp:  [18] 18 (06/18 0533) BP: (115-127)/(65-76) 116/65 mmHg (06/18 0533) SpO2:  [87 %-97 %] 94 % (06/18 0533) Last BM Date: 01/26/13  Intake/Output from previous day: 06/17 0701 - 06/18 0700 In: 960 [P.O.:960] Out: 337 [Urine:300; Chest Tube:37] Intake/Output this shift: Total I/O In: 120 [P.O.:120] Out: -   General appearance: no distress Resp: wheezes occ bilateral wheezes  Cardio: tachycardic rr GI: soft nontender  Lab Results:   Recent Labs  01/26/13 2355 01/27/13 0525  WBC 16.4* 11.6*  HGB 13.6 13.4  HCT 40.9 40.7  PLT 210 209   BMET  Recent Labs  01/26/13 1602 01/26/13 2355 01/27/13 0525  NA 139  --  136  K 4.1  --  4.4  CL 102  --  102  CO2 24  --  25  GLUCOSE 122*  --  111*  BUN 10  --  11  CREATININE 0.96 0.88 0.93  CALCIUM 8.9  --  8.1*   PT/INR No results found for this basename: LABPROT, INR,  in the last 72 hours ABG No results found for this basename: PHART, PCO2, PO2, HCO3,  in the last 72 hours  Studies/Results: Dg Chest Port 1 View  01/29/2013   *RADIOLOGY REPORT*  Clinical Data: Evaluate pneumothorax  PORTABLE CHEST - 1 VIEW  Comparison: 01/28/2013; 01/27/2013; chest CT - 01/26/2013  Findings:  Grossly unchanged cardiac silhouette and mediastinal contours. Stable positioning of support apparatus.  Unchanged tiny left apical pneumothorax.  The amount of left lateral chest wall subcutaneous emphysema is unchanged.  Lung volumes remain reduced with grossly unchanged bibasilar heterogeneous opacities, left greater than right.  No new focal airspace opacity.  No definite evidence of edema.  Unchanged bones.  There is mild gaseous distension of multiple loops of likely colon within the imaged upper  abdomen with index loop measuring approximately 4.8 cm in diameter.  IMPRESSION: 1.  Stable positioning of support apparatus.  Unchanged tiny left apical pneumothorax. 2.  Persistent reduced lung volumes with grossly unchanged basilar opacities, left greater than right, atelectasis versus contusion. 3.  Moderate gaseous distension of several loops of bowel within the imaged upper abdomen nonspecific but could be seen in the setting of developing ileus.  Further evaluation with dedicated abdominal radiographs may be obtained as clinically indicated.   Original Report Authenticated By: Tacey Ruiz, MD   Dg Chest Port 1 View  01/28/2013   *RADIOLOGY REPORT*  Clinical Data: Chest tube, cough.  Follow up pneumothorax.  PORTABLE CHEST - 1 VIEW  Comparison: 01/27/2013  Findings: Left chest tube remains in place, unchanged.  Stable small left apical pneumothorax and subcutaneous emphysema.  Left lower lobe atelectasis is stable.  Slight increased right basilar atelectasis with decreasing lung volumes.  IMPRESSION: Stable left apical pneumothorax.  Bibasilar atelectasis, slightly increased on the right.  Decreasing lung volumes.   Original Report Authenticated By: Charlett Nose, M.D.   Dg Chest Port 1 View  01/27/2013   *RADIOLOGY REPORT*  Clinical Data: Post chest tube placement  PORTABLE CHEST - 1 VIEW  Comparison: 01/27/2013; 01/26/2013; chest CT and 01/26/2013  Findings:  Grossly unchanged cardiac silhouette and mediastinal contours. Stable position of support apparatus.  Suspected slight decrease in size  of a small residual left apical pneumothorax.  The amount of left lateral chest wall and supraclavicular subcutaneous emphysema is grossly unchanged.  Grossly unchanged perihilar and bibasilar opacities, left greater than right.  No new focal airspace opacity. Query trace left-sided effusion.  Unchanged bones.  Mild gaseous distension of the colon within the imaged upper abdomen.  IMPRESSION:  Stable positioning of  support apparatus with slight decrease in persistent small left apical pneumothorax.   Original Report Authenticated By: Tacey Ruiz, MD    Assessment/Plan: [facility]  Multiple left rib fxs w/PTX s/p CT -- I think tube may be in fissure, does not have air leak or tidalling now, no real drainage, apical ptx unchanged on suction last couple days, will place on water seal today and do xr later, needs to be oob as much as possible Left scapula fx -- Ortho consult, NWB, sling  Multiple abrasions -- Local care  EtOH -- CIWA  FEN -- reg diet, needs bowel regimen which will start today VTE -- SCD's, Lovenox  Dispo -- CT, PT/OT   [facility] 01/29/2013

## 2013-01-30 ENCOUNTER — Inpatient Hospital Stay (HOSPITAL_COMMUNITY): Payer: MEDICAID

## 2013-01-30 MED ORDER — TRAMADOL HCL 50 MG PO TABS: 100.0000 mg | ORAL_TABLET | Freq: Four times a day (QID) | ORAL | Status: DC

## 2013-01-30 MED ORDER — OXYCODONE-ACETAMINOPHEN 10-325 MG PO TABS: 1.0000 | ORAL_TABLET | ORAL | Status: DC | PRN

## 2013-01-30 NOTE — Progress Notes (Signed)
Occupational Therapy Treatment Patient Details Name: Aaron Daniel MRN: [id number] DOB: 1960-09-14 Today's Date: 01/30/2013 Time: 2841-3244 OT Time Calculation (min): 12 min  OT Assessment / Plan / Recommendation Comments on Treatment Session Pt able to return demonstrate appropriate donning of sling during session.  Note he will need further oupatient rehab for the left shoulder after discharge.      Follow Up Recommendations  Supervision - Intermittent;Outpatient OT (per MD recommendation)       Equipment Recommendations  None recommended by OT       Frequency Min 2X/week   Plan Discharge plan remains appropriate    Precautions / Restrictions Precautions Precautions: Fall;Shoulder Required Braces or Orthoses: Other Brace/Splint Other Brace/Splint: sling L UE Restrictions Weight Bearing Restrictions: Yes LUE Weight Bearing: Non weight bearing   Pertinent Vitals/Pain Pt with no report of pain during session    ADL  Upper Body Dressing: Performed;Modified independent Where Assessed - Upper Body Dressing: Supine, head of bed up Transfers/Ambulation Related to ADLs: Did not assess this session. Pt overall independent with mobility at this time.   ADL Comments: Educated pt and his mother on sling wear and proper donning.  Pt able to return demonstrate.  Also discussed limiting any UE movement at this time and per chart that he is to follow-up with outpatient therapy 1-2 weeks after discharge.      OT Goals ADL Goals Pt Will Perform Upper Body Bathing: with modified independence;Sitting, chair Pt Will Perform Lower Body Bathing: with modified independence;Sit to stand from chair Pt Will Perform Upper Body Dressing: with modified independence;Sitting, chair;Sitting, bed Pt Will Perform Lower Body Dressing: with modified independence;Sit to stand from bed;Sit to stand from chair Pt Will Transfer to Toilet: with modified independence;Ambulation Pt Will Perform Toileting - Clothing  Manipulation: with modified independence;Standing Pt Will Perform Toileting - Hygiene: with modified independence;Sit to stand from 3-in-1/toilet;Sitting on 3-in-1 or toilet Pt Will Perform Tub/Shower Transfer: Shower transfer;Tub transfer;with supervision;Ambulation Miscellaneous OT Goals Miscellaneous OT Goal #1: Pt will be able to independently direct caregiver on donning/doffing sling while maintaining precautions. OT Goal: Miscellaneous Goal #1 - Progress: Met  Visit Information  Last OT Received On: 01/30/13 Assistance Needed: +1    Subjective Data  Subjective: I think I can get the sling on now. Patient Stated Goal: Wants to return to work as soon as possible.       Cognition  Cognition Arousal/Alertness: Awake/alert Behavior During Therapy: WFL for tasks assessed/performed Overall Cognitive Status: Within Functional Limits for tasks assessed    Mobility  Transfers Transfers: Not assessed       Balance Balance Balance Assessed: No   End of Session OT - End of Session Activity Tolerance: Patient tolerated treatment well Patient left: with call bell/phone within reach;with family/visitor present;in bed     Kashmir Leedy OTR/L Pager number [phone] 01/30/2013, 3:27 PM

## 2013-01-30 NOTE — Progress Notes (Signed)
Pt safe to amb with staff. [place], [place], [place] [phone]
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
## 2013-01-30 NOTE — Progress Notes (Signed)
CXR today shows no expansion of the small left apical PTX.  No air leakage.  Probably can remove CT and discharge patient to home later today.  This patient has been seen and I agree with the findings and treatment plan.  Marta Lamas. Gae Bon, MD, FACS [phone] (pager) [phone] (direct pager) Trauma Surgeon

## 2013-01-30 NOTE — Progress Notes (Addendum)
Physical Therapy Treatment Patient Details Name: Aaron Daniel MRN: [id number] DOB: Apr 26, 1961 Today's Date: 01/30/2013 Time: 4098-1191 PT Time Calculation (min): 11 min  PT Assessment / Plan / Recommendation Comments on Treatment Session  Patient progressed well. Can now ambulate with staff and family. Declined practicing steps again. Will sign off    Follow Up Recommendations  Supervision - Intermittent;Supervision for mobility/OOB     Does the patient have the potential to tolerate intense rehabilitation     Barriers to Discharge        Equipment Recommendations  None recommended by PT    Recommendations for Other Services    Frequency     Plan All goals met and education completed, patient dischaged from PT services    Precautions / Restrictions Precautions Precautions: Fall;Shoulder Required Braces or Orthoses: Other Brace/Splint Other Brace/Splint: sling L UE Restrictions LUE Weight Bearing: Non weight bearing   Pertinent Vitals/Pain denied    Mobility  Bed Mobility Supine to Sit: 7: Independent Transfers Sit to Stand: 6: Modified independent (Device/Increase time) Stand to Sit: 6: Modified independent (Device/Increase time) Ambulation/Gait Ambulation/Gait Assistance: 6: Modified independent (Device/Increase time) Ambulation Distance (Feet): 600 Feet Assistive device: None Ambulation/Gait Assistance Details: Noted less swayying and no LOB Gait Pattern: Step-through pattern Stairs: No    Exercises     PT Diagnosis:    PT Problem List:   PT Treatment Interventions:     PT Goals Acute Rehab PT Goals PT Goal: Supine/Side to Sit - Progress: Met PT Goal: Sit to Supine/Side - Progress: Met PT Goal: Sit to Stand - Progress: Met PT Goal: Stand to Sit - Progress: Met PT Goal: Ambulate - Progress: Met PT Goal: Up/Down Stairs - Progress: Other (comment) (decilned further practice)  Visit Information  Last PT Received On: 01/30/13 Assistance Needed: +1     Subjective Data      Cognition  Cognition Arousal/Alertness: Awake/alert Behavior During Therapy: WFL for tasks assessed/performed Overall Cognitive Status: Within Functional Limits for tasks assessed    Balance     End of Session PT - End of Session Activity Tolerance: Patient tolerated treatment well Patient left: in bed;with call bell/phone within reach   GP     Fredrich Birks 01/30/2013, 9:34 AM 01/30/2013 Fredrich Birks PTA [phone] pager [phone] office

## 2013-01-30 NOTE — Progress Notes (Signed)
UR completed 
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
## 2013-02-02 ENCOUNTER — Emergency Department (HOSPITAL_COMMUNITY)
Admission: EM | Admit: 2013-02-02 | Discharge: 2013-02-02 | Disposition: A | Discharge status: Home / Self Care | Payer: MEDICAID | Attending: Emergency Medicine | Admitting: Emergency Medicine

## 2013-02-02 ENCOUNTER — Encounter (HOSPITAL_COMMUNITY): Payer: Self-pay

## 2013-02-02 DIAGNOSIS — F17200 Nicotine dependence, unspecified, uncomplicated: Secondary | ICD-10-CM | POA: Insufficient documentation

## 2013-02-02 DIAGNOSIS — Z9889 Other specified postprocedural states: Secondary | ICD-10-CM | POA: Insufficient documentation

## 2013-02-02 DIAGNOSIS — Z5189 Encounter for other specified aftercare: Secondary | ICD-10-CM

## 2013-02-02 DIAGNOSIS — Z4802 Encounter for removal of sutures: Secondary | ICD-10-CM | POA: Insufficient documentation
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
## 2013-02-03 ENCOUNTER — Telehealth (HOSPITAL_COMMUNITY): Payer: Self-pay | Admitting: Emergency Medicine

## 2013-02-03 NOTE — Telephone Encounter (Signed)
Noted ED visit yesterday. Told patient he could follow up with [place] prn; he was fine with that. I gave him Dr. Ophelia Charter' office number again so that he could make an appt with him.

## 2013-02-04 NOTE — ED Provider Notes (Signed)
Medical screening examination/treatment/procedure(s) were conducted as a shared visit with non-physician practitioner(s) and myself.  I personally evaluated the patient during the encounter  Derwood Kaplan, MD 02/04/13 [phone]

## 2014-05-06 ENCOUNTER — Inpatient Hospital Stay (HOSPITAL_COMMUNITY)
Admission: EM | Admit: 2014-05-06 | Discharge: 2014-05-09 | DRG: 392 | Disposition: A | Discharge status: Home / Self Care | Payer: Self-pay | Attending: Internal Medicine | Admitting: Internal Medicine

## 2014-05-06 ENCOUNTER — Emergency Department (HOSPITAL_COMMUNITY): Payer: Self-pay

## 2014-05-06 ENCOUNTER — Encounter (HOSPITAL_COMMUNITY): Payer: Self-pay | Admitting: Emergency Medicine

## 2014-05-06 DIAGNOSIS — K209 Esophagitis, unspecified without bleeding: Principal | ICD-10-CM | POA: Diagnosis present

## 2014-05-06 DIAGNOSIS — F1210 Cannabis abuse, uncomplicated: Secondary | ICD-10-CM | POA: Diagnosis present

## 2014-05-06 DIAGNOSIS — Z720 Tobacco use: Secondary | ICD-10-CM | POA: Diagnosis present

## 2014-05-06 DIAGNOSIS — T07XXXA Unspecified multiple injuries, initial encounter: Secondary | ICD-10-CM

## 2014-05-06 DIAGNOSIS — F1010 Alcohol abuse, uncomplicated: Secondary | ICD-10-CM | POA: Diagnosis present

## 2014-05-06 DIAGNOSIS — F17200 Nicotine dependence, unspecified, uncomplicated: Secondary | ICD-10-CM | POA: Diagnosis present

## 2014-05-06 DIAGNOSIS — F1410 Cocaine abuse, uncomplicated: Secondary | ICD-10-CM | POA: Diagnosis present

## 2014-05-06 DIAGNOSIS — Z23 Encounter for immunization: Secondary | ICD-10-CM

## 2014-05-06 DIAGNOSIS — Z79899 Other long term (current) drug therapy: Secondary | ICD-10-CM

## 2014-05-06 DIAGNOSIS — F1020 Alcohol dependence, uncomplicated: Secondary | ICD-10-CM | POA: Diagnosis present

## 2014-05-06 DIAGNOSIS — K219 Gastro-esophageal reflux disease without esophagitis: Secondary | ICD-10-CM | POA: Diagnosis present

## 2014-05-06 HISTORY — DX: Other specified health status: Z78.9

## 2014-05-06 LAB — I-STAT TROPONIN, ED
TROPONIN I, POC: 0 ng/mL (ref 0.00–0.08)
TROPONIN I, POC: 0 ng/mL (ref 0.00–0.08)

## 2014-05-06 LAB — BASIC METABOLIC PANEL
Anion gap: 11 (ref 5–15)
BUN: 6 mg/dL (ref 6–23)
CO2: 29 meq/L (ref 19–32)
Calcium: 9.1 mg/dL (ref 8.4–10.5)
Chloride: 100 mEq/L (ref 96–112)
Creatinine, Ser: 0.9 mg/dL (ref 0.50–1.35)
GFR calc Af Amer: >90 mL/min (ref >90)
GFR calc non Af Amer: >90 mL/min (ref >90)
GLUCOSE: 135 mg/dL — AB (ref 70–99)
POTASSIUM: 3.7 meq/L (ref 3.7–5.3)
SODIUM: 140 meq/L (ref 137–147)

## 2014-05-06 LAB — CBC
HCT: 43.9 % (ref 39.0–52.0)
HEMOGLOBIN: 14.7 g/dL (ref 13.0–17.0)
MCH: 29.9 pg (ref 26.0–34.0)
MCHC: 33.5 g/dL (ref 30.0–36.0)
MCV: 89.2 fL (ref 78.0–100.0)
Platelets: 250 10*3/uL (ref 150–400)
RBC: 4.92 MIL/uL (ref 4.22–5.81)
RDW: 13.3 % (ref 11.5–15.5)
WBC: 15.8 10*3/uL — ABNORMAL HIGH (ref 4.0–10.5)

## 2014-05-06 LAB — URINALYSIS, ROUTINE W REFLEX MICROSCOPIC
BILIRUBIN URINE: NEGATIVE
GLUCOSE, UA: NEGATIVE mg/dL
Hgb urine dipstick: NEGATIVE
KETONES UR: 15 mg/dL — AB
LEUKOCYTES UA: NEGATIVE
Nitrite: NEGATIVE
PH: 7.5 (ref 5.0–8.0)
Protein, ur: NEGATIVE mg/dL
Specific Gravity, Urine: 1.013 (ref 1.005–1.030)
Urobilinogen, UA: 1 mg/dL (ref 0.0–1.0)

## 2014-05-06 LAB — HEPATIC FUNCTION PANEL
ALBUMIN: 3.3 g/dL — AB (ref 3.5–5.2)
ALK PHOS: 62 U/L (ref 39–117)
ALT: 8 U/L (ref 0–53)
AST: 15 U/L (ref 0–37)
Bilirubin, Direct: <0.2 mg/dL (ref 0.0–0.3)
TOTAL PROTEIN: 7 g/dL (ref 6.0–8.3)
Total Bilirubin: 0.3 mg/dL (ref 0.3–1.2)

## 2014-05-06 LAB — I-STAT CG4 LACTIC ACID, ED: Lactic Acid, Venous: 1.19 mmol/L (ref 0.5–2.2)

## 2014-05-06 LAB — LIPASE, BLOOD: Lipase: 34 U/L (ref 11–59)

## 2014-05-06 LAB — POC OCCULT BLOOD, ED: Fecal Occult Bld: NEGATIVE

## 2014-05-06 MED ORDER — HYDROMORPHONE HCL 1 MG/ML IJ SOLN
0.5000 mg | Freq: Once | INTRAMUSCULAR | Status: AC
  Administered 2014-05-06: 0.5 mg via INTRAVENOUS
  Filled 2014-05-06: qty 1

## 2014-05-06 MED ORDER — IOHEXOL 300 MG/ML  SOLN
100.0000 mL | Freq: Once | INTRAMUSCULAR | Status: AC | PRN
  Administered 2014-05-06: 100 mL via INTRAVENOUS

## 2014-05-06 MED ORDER — GI COCKTAIL ~~LOC~~
30.0000 mL | Freq: Once | ORAL | Status: AC
Start: 2014-05-06 — End: 2014-05-06
  Administered 2014-05-06: 30 mL via ORAL
  Filled 2014-05-06: qty 30

## 2014-05-06 MED ORDER — ONDANSETRON HCL 4 MG/2ML IJ SOLN: 4.0000 mg | Freq: Once | INTRAMUSCULAR | Status: DC

## 2014-05-06 MED ORDER — SODIUM CHLORIDE 0.9 % IV BOLUS (SEPSIS)
1000.0000 mL | Freq: Once | INTRAVENOUS | Status: AC
  Administered 2014-05-06: 1000 mL via INTRAVENOUS

## 2014-05-06 MED ORDER — HYDROMORPHONE HCL 1 MG/ML IJ SOLN
1.0000 mg | Freq: Once | INTRAMUSCULAR | Status: AC
  Administered 2014-05-06: 1 mg via INTRAVENOUS
  Filled 2014-05-06: qty 1

## 2014-05-06 MED ORDER — MORPHINE SULFATE 4 MG/ML IJ SOLN
4.0000 mg | Freq: Once | INTRAMUSCULAR | Status: AC
  Administered 2014-05-06: 4 mg via INTRAVENOUS
  Filled 2014-05-06: qty 1

## 2014-05-06 MED ORDER — IOHEXOL 300 MG/ML  SOLN
25.0000 mL | Freq: Once | INTRAMUSCULAR | Status: AC | PRN
  Administered 2014-05-06: 25 mL via ORAL

## 2014-05-06 MED ORDER — PANTOPRAZOLE SODIUM 40 MG PO TBEC: 40.0000 mg | DELAYED_RELEASE_TABLET | Freq: Two times a day (BID) | ORAL | Status: DC

## 2014-05-06 MED ORDER — ONDANSETRON HCL 4 MG/2ML IJ SOLN
4.0000 mg | Freq: Once | INTRAMUSCULAR | Status: AC
  Administered 2014-05-06: 4 mg via INTRAVENOUS
  Filled 2014-05-06: qty 2

## 2014-05-06 MED ORDER — HYDROMORPHONE HCL 1 MG/ML IJ SOLN: 1.0000 mg | Freq: Once | INTRAMUSCULAR | Status: DC

## 2014-05-06 MED ORDER — SUCRALFATE 1 G PO TABS
1.0000 g | ORAL_TABLET | Freq: Four times a day (QID) | ORAL | Status: DC
  Administered 2014-05-07 – 2014-05-09 (×11): 1 g via ORAL
  Filled 2014-05-06 (×14): qty 1

## 2014-05-06 MED ORDER — SODIUM CHLORIDE 0.9 % IV SOLN
80.0000 mg | Freq: Once | INTRAVENOUS | Status: AC
  Administered 2014-05-07: 80 mg via INTRAVENOUS
  Filled 2014-05-06: qty 80

## 2014-05-06 NOTE — ED Notes (Signed)
Admitting MD at bedside.

## 2014-05-06 NOTE — ED Notes (Signed)
This RN contacted pharmacy in regards to the status of the IV protonix and was told they will be tubing it shortly.

## 2014-05-06 NOTE — ED Notes (Signed)
Lab results reported to Dr. J. Knapp. 

## 2014-05-06 NOTE — ED Notes (Signed)
Pt reports he has finished drinking his oral CT contrast.

## 2014-05-06 NOTE — ED Provider Notes (Signed)
CSN: [id number]     Arrival date & time 05/06/14  1046 History   First MD Initiated Contact with Patient 05/06/14 1608     Chief Complaint  Patient presents with  . Chest Pain  . Emesis     (Consider location/radiation/quality/duration/timing/severity/associated sxs/prior Treatment) HPI Comments: Patient is a 53 year old male who presents to the ED for evaluation of chest pain, nausea, and vomiting. He reports he did not feel well starting Monday and had associated nausea and vomiting. He had red emesis which he feels like was blood. He denies any diarrhea. He notes decreased BM. Patient reports food makes pain worse. He has been drinking [employer] and Gatorade. He has taken tums and alka seltzer without relief of his symptoms. The chest pain is a dull pain all the time, but is sharp with food. He had hot and cold chills. He drinks a 6 pack of beer daily, but has not drank since Monday. He does not take NSAIDs.    Patient is a 53 y.o. male presenting with chest pain and vomiting. The history is provided by the patient. No language interpreter was used.  Chest Pain Associated symptoms: abdominal pain, cough, nausea and vomiting   Associated symptoms: no fever and no shortness of breath   Emesis Associated symptoms: abdominal pain and chills   Associated symptoms: no diarrhea     History reviewed. No pertinent past medical history. Past Surgical History  Procedure Laterality Date  . Chest tube insertion     History reviewed. No pertinent family history. History  Substance Use Topics  . Smoking status: Current Every Day Smoker -- 1.00 packs/day    Types: Cigarettes  . Smokeless tobacco: Never Used  . Alcohol Use: 1.2 oz/week    2 Cans of beer per week    Review of Systems  Constitutional: Positive for chills. Negative for fever.  Respiratory: Positive for cough. Negative for shortness of breath.   Cardiovascular: Positive for chest pain.  Gastrointestinal: Positive for  nausea, vomiting and abdominal pain. Negative for diarrhea and constipation.  All other systems reviewed and are negative.     Allergies  Bee venom  Home Medications   Prior to Admission medications   Not on File   BP 121/84  Pulse 82  Temp(Src) 98.4 F (36.9 C) (Oral)  Resp 16  Ht  (1.676 m)  Wt 140 lb (63.504 kg)  BMI 22.61 kg/m2  SpO2 99% Physical Exam  Nursing note and vitals reviewed. Constitutional: He is oriented to person, place, and time. He appears well-developed and well-nourished. No distress.  HENT:  Head: Normocephalic and atraumatic.  Right Ear: External ear normal.  Left Ear: External ear normal.  Nose: Nose normal.  Eyes: Conjunctivae are normal.  Neck: Normal range of motion. No tracheal deviation present.  Cardiovascular: Normal rate, regular rhythm and normal heart sounds.   Pulmonary/Chest: Effort normal and breath sounds normal. No stridor.  Abdominal: Soft. He exhibits no distension. There is tenderness in the epigastric area.  Musculoskeletal: Normal range of motion.  Neurological: He is alert and oriented to person, place, and time.  Skin: Skin is warm and dry. He is not diaphoretic.  Psychiatric: He has a normal mood and affect. His behavior is normal.    ED Course  Procedures (including critical care time) Labs Review Labs Reviewed  CBC - Abnormal; Notable for the following:    WBC 15.8 (*)    All other components within normal limits  BASIC  METABOLIC PANEL - Abnormal; Notable for the following:    Glucose, Bld 135 (*)    All other components within normal limits  URINALYSIS, ROUTINE W REFLEX MICROSCOPIC - Abnormal; Notable for the following:    Ketones, ur 15 (*)    All other components within normal limits  HEPATIC FUNCTION PANEL - Abnormal; Notable for the following:    Albumin 3.3 (*)    All other components within normal limits  COMPREHENSIVE METABOLIC PANEL - Abnormal; Notable for the following:    BUN 5 (*)    Calcium  7.8 (*)    Total Protein 5.5 (*)    Albumin 2.6 (*)    All other components within normal limits  URINE RAPID DRUG SCREEN (HOSP PERFORMED) - Abnormal; Notable for the following:    Opiates POSITIVE (*)    Cocaine POSITIVE (*)    Tetrahydrocannabinol POSITIVE (*)    All other components within normal limits  URINE CULTURE  LIPASE, BLOOD  HIV ANTIBODY (ROUTINE TESTING)  PROTIME-INR  I-STAT TROPOININ, ED  I-STAT CG4 LACTIC ACID, ED  Rosezena Sensor, ED  POC OCCULT BLOOD, ED    Imaging Review Dg Chest 2 View  05/06/2014   CLINICAL DATA:  Chest pain and emesis  EXAM: CHEST  2 VIEW  COMPARISON:  January 30, 2013  FINDINGS: There is no edema or consolidation. Heart size and pulmonary vascularity are normal. No adenopathy. No apparent pneumothorax. There is evidence of old trauma involving the left scapula. There is an old healed fracture of the anterior left fourth rib.  IMPRESSION: No edema or consolidation.   Electronically Signed   By: Bretta Bang M.D.   On: 05/06/2014 11:31   Ct Abdomen Pelvis W Contrast  05/06/2014   CLINICAL DATA:  Chest pain and emesis  EXAM: CT ABDOMEN AND PELVIS WITH CONTRAST  TECHNIQUE: Multidetector CT imaging of the abdomen and pelvis was performed using the standard protocol following bolus administration of intravenous contrast.  CONTRAST:  OMNIPAQUE IOHEXOL 300 MG/ML  SOLN  COMPARISON:  01/26/2013  FINDINGS: BODY WALL: Unremarkable.  LOWER CHEST: There is marked circumferential low-density thickening of the esophagus. The mucosa appears avidly enhancing in this region. No evidence of mucosal based mass. There is surrounding periesophageal fluid, non loculated, which supports diagnosis of esophagitis. No lower pneumomediastinum. No lower mediastinal adenopathy. Small sliding-type hiatal hernia.  ABDOMEN/PELVIS:  Liver: No focal abnormality.  Biliary: No evidence of biliary obstruction or stone.  Pancreas: Unremarkable.  Spleen: Unremarkable.  Adrenals:  Unremarkable.  Kidneys and ureters: No hydronephrosis or stone.  Bladder: Unremarkable.  Reproductive: Unremarkable.  Bowel: No obstruction. Normal appendix.  Retroperitoneum: No mass or adenopathy.  Peritoneum: No ascites or pneumoperitoneum.  Vascular: No acute abnormality.  OSSEOUS: No acute abnormalities.  IMPRESSION: Severe distal esophagitis. Esophagram or endoscopy after treatment recommended to ensure normalization.   Electronically Signed   By: Tiburcio Pea M.D.   On: 05/06/2014 22:14     EKG Interpretation None      11:06 PM Discussed case with GI who recommends either admission for pain control and endoscopy or close follow up.   Patient reports he will likely not follow up if he goes home. He will rake recommended medications and attempt to feel better on own.   11:28 PM Dr. Rhea Belton aware that patient will be admitted and will se patient in am.   MDM   Final diagnoses:  Tobacco abuse  Esophagitis  Ethanolism  Alcohol abuse    Patient  presents to ED with epigastric pain. Hx of alcohol abuse. Patient is an every day smoker. Severe distal esophagitis. Discussed case with GI and medicine. Patient unlikely to follow up as an outpatient. Will admit for pain control and further management. Admission and consultation are appreciated. Dr. Littie Deeds evaluated patient and agrees with plan. Patient / Family / Caregiver informed of clinical course, understand medical decision-making process, and agree with plan.     Mora Bellman, PA-C 05/09/14 [phone]

## 2014-05-06 NOTE — H&P (Signed)
[facility] History and Physical  Aaron Daniel ZOX:[id number] DOB: 26-Sep-1960 DOA: 05/06/2014  Referring physician: ED physician PCP: No primary provider on file.  Specialists:   Chief Complaint: Epigastric pain  HPI: Aaron Daniel is a 53 y.o. male is a past medical history of tobacco abuse, alcohol abuse, who presents with epigastric pain.  Patient is everyday drinker. He drinks 6 pack per day regularly. Last drink was 3 days ago.  He started having epigastric pain 3 days ago. It is located at the epigastric area constant, with intermittent accentuation, nonradiating, sharp and burning pain. It is aggravated by eating or deep breath. It is associated with nausea and vomiting. He vomited approximately 3-4 times a day. There's no blood in the vomitus. He does not have fever, chills, cough, diarrhea, rashes.  CT abdomen done in ED, showed severe distal esophagitis. Troponin negative. Patient has leukocytosis with WBC 15.8. GI was consulted by ED, suggested EGD in morning. Patient is admitted to inpatient for observation and EGD procedure.  Review of Systems: As presented in the history of presenting illness, rest negative.  Where does patient live? Lives with his daughter in [place] Can patient participate in ADLs? Yes  Allergy:  Allergies  Allergen Reactions  . Bee Venom Swelling    History reviewed. No pertinent past medical history.  Past Surgical History  Procedure Laterality Date  . Chest tube insertion      Social History:  reports that he has been smoking Cigarettes.  He has been smoking about 1.00 pack per day. He has never used smokeless tobacco. He reports that he drinks about 1.2 ounces of alcohol per week. He reports that he does not use illicit drugs.  Family History:  Family History  Problem Relation Age of Onset  . Hypertension Mother      Prior to Admission medications   Not on File    Physical Exam: Filed Vitals:   05/06/14 2000 05/06/14 2006  05/06/14 2030 05/06/14 2100  BP: 119/73 119/73 109/66 115/64  Pulse: 72 74 102 76  Temp:  99 F (37.2 C)    TempSrc:  Oral    Resp: Height:      Weight:      SpO2: 97% 98% 98% 97%   General: Not in acute distress. Dry mucous and membrane HEENT:       Eyes: PERRL, EOMI, no scleral icterus       ENT: No discharge from the ears and nose, no pharynx injection, no tonsillar enlargement.        Neck: No JVD, no bruit, no mass felt. Cardiac: S1/S2, RRR, No murmurs, gallops or rubs Pulm: Good air movement bilaterally. Clear to auscultation bilaterally. No rales, wheezing, rhonchi or rubs. Abd: Soft, nondistended, nontender, no rebound pain, no organomegaly, BS present Ext: No edema. 2+DP/PT pulse bilaterally Musculoskeletal: No joint deformities, erythema, or stiffness, ROM full Skin: No rashes.  Neuro: Alert and oriented X3, cranial nerves II-XII grossly intact, muscle strength 5/5 in all extremeties, sensation to light touch intact. Brachial reflex 2+ bilaterally. Knee reflex 1+ bilaterally. Psych: Patient is not psychotic, no suicidal or hemocidal ideation.  Labs on Admission:  Basic Metabolic Panel:  Recent Labs Lab 05/06/14 1057  NA 140  K 3.7  CL 100  CO2 29  GLUCOSE 135*  BUN 6  CREATININE 0.90  CALCIUM 9.1   Liver Function Tests:  Recent Labs Lab 05/06/14 1645  AST 15  ALT 8  ALKPHOS  62  BILITOT 0.3  PROT 7.0  ALBUMIN 3.3*    Recent Labs Lab 05/06/14 1645  LIPASE 34   No results found for this basename: AMMONIA,  in the last 168 hours CBC:  Recent Labs Lab 05/06/14 1057  WBC 15.8*  HGB 14.7  HCT 43.9  MCV 89.2  PLT 250   Cardiac Enzymes: No results found for this basename: CKTOTAL, CKMB, CKMBINDEX, TROPONINI,  in the last 168 hours  BNP (last 3 results) No results found for this basename: PROBNP,  in the last 8760 hours CBG: No results found for this basename: GLUCAP,  in the last 168 hours  Radiological Exams on  Admission: Dg Chest 2 View  05/06/2014   CLINICAL DATA:  Chest pain and emesis  EXAM: CHEST  2 VIEW  COMPARISON:  January 30, 2013  FINDINGS: There is no edema or consolidation. Heart size and pulmonary vascularity are normal. No adenopathy. No apparent pneumothorax. There is evidence of old trauma involving the left scapula. There is an old healed fracture of the anterior left fourth rib.  IMPRESSION: No edema or consolidation.   Electronically Signed   By: Bretta Bang M.D.   On: 05/06/2014 11:31   Ct Abdomen Pelvis W Contrast  05/06/2014   CLINICAL DATA:  Chest pain and emesis  EXAM: CT ABDOMEN AND PELVIS WITH CONTRAST  TECHNIQUE: Multidetector CT imaging of the abdomen and pelvis was performed using the standard protocol following bolus administration of intravenous contrast.  CONTRAST:  OMNIPAQUE IOHEXOL 300 MG/ML  SOLN  COMPARISON:  01/26/2013  FINDINGS: BODY WALL: Unremarkable.  LOWER CHEST: There is marked circumferential low-density thickening of the esophagus. The mucosa appears avidly enhancing in this region. No evidence of mucosal based mass. There is surrounding periesophageal fluid, non loculated, which supports diagnosis of esophagitis. No lower pneumomediastinum. No lower mediastinal adenopathy. Small sliding-type hiatal hernia.  ABDOMEN/PELVIS:  Liver: No focal abnormality.  Biliary: No evidence of biliary obstruction or stone.  Pancreas: Unremarkable.  Spleen: Unremarkable.  Adrenals: Unremarkable.  Kidneys and ureters: No hydronephrosis or stone.  Bladder: Unremarkable.  Reproductive: Unremarkable.  Bowel: No obstruction. Normal appendix.  Retroperitoneum: No mass or adenopathy.  Peritoneum: No ascites or pneumoperitoneum.  Vascular: No acute abnormality.  OSSEOUS: No acute abnormalities.  IMPRESSION: Severe distal esophagitis. Esophagram or endoscopy after treatment recommended to ensure normalization.   Electronically Signed   By: Tiburcio Pea M.D.   On: 05/06/2014 22:14     EKG: Independently reviewed.   Assessment/Plan Principal Problem:   Esophagitis Active Problems:   Ethanolism   Tobacco abuse  1. Esophagitis: Patient's epigastric pain is most likely caused by alcoholic esophagitis. Given patient's history of substance abuse, it is important to rule out HIV related esophagitis, such as herpes or MCV esophagitis. It is unlikely to have ACS given negative trop and no significant risk factors. GI was consulted, suggested putting pt on PPI and EGD morning.  - Admit to MedSurg bed. - Protonix 40 mg twice a day - Sacral folate - IVF: ns 125 cc/h - pain control with IV morpnin since patient is on NPO and has nausea and vomiting. - Zofran for nausea, check EKG for QTc - NPO for EGD procedure in AM  2. alcohol abuse: Last drink was 3 days ago. No signs of withdrawal or DT on admission -CIWA protocol  3. tobacco abuse: One pack a day for more than 20 years. - Nicotine patch - Consult to social work   DVT ppx:  SQ Heparin    Code Status: Full code Family Communication: None at bed side. Disposition Plan: Admit to inpatient  Lorretta Harp [facility] Pager [phone]  If 7PM-7AM, please contact night-coverage [email or web address] Password [facility] 05/07/2014, 12:16 AM

## 2014-05-06 NOTE — ED Notes (Signed)
Pt reports initially getting sick and n/v on wed. Now having chest pain that he assumed was acid reflux but has become more severe. Also having cough. Airway intact and ekg being done at triage.

## 2014-05-07 ENCOUNTER — Encounter (HOSPITAL_COMMUNITY): Payer: Self-pay | Admitting: Internal Medicine

## 2014-05-07 ENCOUNTER — Telehealth: Payer: Self-pay | Admitting: *Deleted

## 2014-05-07 DIAGNOSIS — F1010 Alcohol abuse, uncomplicated: Secondary | ICD-10-CM | POA: Diagnosis present

## 2014-05-07 DIAGNOSIS — Z720 Tobacco use: Secondary | ICD-10-CM | POA: Diagnosis present

## 2014-05-07 LAB — RAPID URINE DRUG SCREEN, HOSP PERFORMED
AMPHETAMINES: NOT DETECTED
BENZODIAZEPINES: NOT DETECTED
Barbiturates: NOT DETECTED
COCAINE: POSITIVE — AB
OPIATES: POSITIVE — AB
TETRAHYDROCANNABINOL: POSITIVE — AB

## 2014-05-07 LAB — COMPREHENSIVE METABOLIC PANEL
ALBUMIN: 2.6 g/dL — AB (ref 3.5–5.2)
ALT: 7 U/L (ref 0–53)
AST: 13 U/L (ref 0–37)
Alkaline Phosphatase: 51 U/L (ref 39–117)
Anion gap: 8 (ref 5–15)
BUN: 5 mg/dL — ABNORMAL LOW (ref 6–23)
CHLORIDE: 103 meq/L (ref 96–112)
CO2: 28 mEq/L (ref 19–32)
CREATININE: 0.99 mg/dL (ref 0.50–1.35)
Calcium: 7.8 mg/dL — ABNORMAL LOW (ref 8.4–10.5)
GFR calc Af Amer: >90 mL/min (ref >90)
GFR calc non Af Amer: >90 mL/min (ref >90)
Glucose, Bld: 84 mg/dL (ref 70–99)
Potassium: 4 mEq/L (ref 3.7–5.3)
Sodium: 139 mEq/L (ref 137–147)
Total Bilirubin: 0.4 mg/dL (ref 0.3–1.2)
Total Protein: 5.5 g/dL — ABNORMAL LOW (ref 6.0–8.3)

## 2014-05-07 LAB — URINE CULTURE
COLONY COUNT: NO GROWTH
CULTURE: NO GROWTH

## 2014-05-07 LAB — HIV ANTIBODY (ROUTINE TESTING W REFLEX): HIV 1&2 Ab, 4th Generation: NONREACTIVE

## 2014-05-07 LAB — PROTIME-INR
INR: 1.03 (ref 0.00–1.49)
PROTHROMBIN TIME: 13.5 s (ref 11.6–15.2)

## 2014-05-07 MED ORDER — SODIUM CHLORIDE 0.9 % IV SOLN
INTRAVENOUS | Status: DC
  Administered 2014-05-07: 01:00:00 via INTRAVENOUS

## 2014-05-07 MED ORDER — ONDANSETRON HCL 4 MG PO TABS: 4.0000 mg | ORAL_TABLET | Freq: Four times a day (QID) | ORAL | Status: DC | PRN

## 2014-05-07 MED ORDER — ADULT MULTIVITAMIN W/MINERALS CH
1.0000 | ORAL_TABLET | Freq: Every day | ORAL | Status: DC
  Administered 2014-05-07 – 2014-05-09 (×3): 1 via ORAL
  Filled 2014-05-07 (×3): qty 1

## 2014-05-07 MED ORDER — SODIUM CHLORIDE 0.9 % IV SOLN: 250.0000 mL | INTRAVENOUS | Status: DC | PRN

## 2014-05-07 MED ORDER — NICOTINE 21 MG/24HR TD PT24
21.0000 mg | MEDICATED_PATCH | Freq: Every day | TRANSDERMAL | Status: DC
  Administered 2014-05-07 – 2014-05-09 (×4): 21 mg via TRANSDERMAL
  Filled 2014-05-07 (×4): qty 1

## 2014-05-07 MED ORDER — INFLUENZA VAC SPLIT QUAD 0.5 ML IM SUSY
0.5000 mL | PREFILLED_SYRINGE | INTRAMUSCULAR | Status: AC
  Administered 2014-05-08: 0.5 mL via INTRAMUSCULAR
  Filled 2014-05-07: qty 0.5

## 2014-05-07 MED ORDER — LORAZEPAM 2 MG/ML IJ SOLN: 0.0000 mg | Freq: Two times a day (BID) | INTRAMUSCULAR | Status: DC

## 2014-05-07 MED ORDER — LORAZEPAM 2 MG/ML IJ SOLN: 0.0000 mg | Freq: Four times a day (QID) | INTRAMUSCULAR | Status: AC

## 2014-05-07 MED ORDER — SODIUM CHLORIDE 0.9 % IJ SOLN
3.0000 mL | Freq: Two times a day (BID) | INTRAMUSCULAR | Status: DC
  Administered 2014-05-07 – 2014-05-09 (×4): 3 mL via INTRAVENOUS

## 2014-05-07 MED ORDER — MORPHINE SULFATE 2 MG/ML IJ SOLN
2.0000 mg | INTRAMUSCULAR | Status: DC | PRN
  Administered 2014-05-07 – 2014-05-09 (×15): 2 mg via INTRAVENOUS
  Filled 2014-05-07 (×15): qty 1

## 2014-05-07 MED ORDER — VITAMIN B-1 100 MG PO TABS
100.0000 mg | ORAL_TABLET | Freq: Every day | ORAL | Status: DC
  Administered 2014-05-08 – 2014-05-09 (×2): 100 mg via ORAL
  Filled 2014-05-07 (×3): qty 1

## 2014-05-07 MED ORDER — THIAMINE HCL 100 MG/ML IJ SOLN
100.0000 mg | Freq: Every day | INTRAMUSCULAR | Status: DC
  Administered 2014-05-07: 100 mg via INTRAVENOUS
  Filled 2014-05-07 (×3): qty 1

## 2014-05-07 MED ORDER — HEPARIN SODIUM (PORCINE) 5000 UNIT/ML IJ SOLN
5000.0000 [IU] | Freq: Three times a day (TID) | INTRAMUSCULAR | Status: DC
  Administered 2014-05-07: 5000 [IU] via SUBCUTANEOUS
  Filled 2014-05-07 (×4): qty 1

## 2014-05-07 MED ORDER — ONDANSETRON HCL 4 MG/2ML IJ SOLN: 4.0000 mg | Freq: Four times a day (QID) | INTRAMUSCULAR | Status: DC | PRN

## 2014-05-07 MED ORDER — FOLIC ACID 1 MG PO TABS
1.0000 mg | ORAL_TABLET | Freq: Every day | ORAL | Status: DC
  Administered 2014-05-07 – 2014-05-09 (×3): 1 mg via ORAL
  Filled 2014-05-07 (×3): qty 1

## 2014-05-07 MED ORDER — PANTOPRAZOLE SODIUM 40 MG IV SOLR
40.0000 mg | Freq: Two times a day (BID) | INTRAVENOUS | Status: DC
  Administered 2014-05-07 (×2): 40 mg via INTRAVENOUS
  Filled 2014-05-07 (×4): qty 40

## 2014-05-07 MED ORDER — SODIUM CHLORIDE 0.9 % IJ SOLN
3.0000 mL | INTRAMUSCULAR | Status: DC | PRN
  Administered 2014-05-09: 3 mL via INTRAVENOUS

## 2014-05-07 NOTE — ED Notes (Signed)
This RN called Company secretary to determine cause of delay with bed assignment and was told they are just waiting on a bed to be cleaned and ready.

## 2014-05-07 NOTE — Progress Notes (Signed)
Patient ID: Aaron Daniel  male  ZOX:[id number]    DOB: 09-May-1961    DOA: 05/06/2014  PCP: No PCP Per Patient  Assessment/Plan: Principal Problem:   Esophagitis with acute lower chest/epigastric pain - CT abdomen and pelvis showed severe distal esophagitis recommending EGD - Continue IV PPI, n.p.o., awaiting EGD - GI has been consulted (Dr Rhea Belton)   Active Problems:   Tobacco abuse - Patient counseled strongly, place on nicotine patch    Alcohol abuse - Continue CIWA scale with Ativan   DVT Prophylaxis: SCD's  Code Status: Full code  Family Communication:  Disposition:  Consultants:  Gastroenterology  Procedures:  None  Antibiotics:  None    Subjective: Patient seen examined, still having upper epigastric and lower chest pain  Objective: Weight change:   Intake/Output Summary (Last 24 hours) at 05/07/14 1208 Last data filed at 05/07/14 0906  Gross per 24 hour  Intake    250 ml  Output      0 ml  Net    250 ml   Blood pressure 121/72, pulse 75, temperature 98.2 F (36.8 C), temperature source Oral, resp. rate 16, height  (1.676 m), weight 63.504 kg (140 lb), SpO2 100.00%.  Physical Exam: General: Alert and awake, oriented x3, not in any acute distress. CVS: S1-S2 clear, no murmur rubs or gallops Chest: clear to auscultation bilaterally, no wheezing, rales or rhonchi Abdomen: soft mild epigastric tenderness to deep palpation, nondistended, normal bowel sounds  Extremities: no cyanosis, clubbing or edema noted bilaterally Neuro: Cranial nerves II-XII intact, no focal neurological deficits  Lab Results: Basic Metabolic Panel:  Recent Labs Lab 05/06/14 1057 05/07/14 0300  NA 140 139  K 3.7 4.0  CL 100 103  CO2 29 28  GLUCOSE 135* 84  BUN 6 5*  CREATININE 0.90 0.99  CALCIUM 9.1 7.8*   Liver Function Tests:  Recent Labs Lab 05/06/14 1645 05/07/14 0300  AST 15 13  ALT 8 7  ALKPHOS 62 51  BILITOT 0.3 0.4  PROT 7.0 5.5*  ALBUMIN  3.3* 2.6*    Recent Labs Lab 05/06/14 1645  LIPASE 34   No results found for this basename: AMMONIA,  in the last 168 hours CBC:  Recent Labs Lab 05/06/14 1057  WBC 15.8*  HGB 14.7  HCT 43.9  MCV 89.2  PLT 250   Cardiac Enzymes: No results found for this basename: CKTOTAL, CKMB, CKMBINDEX, TROPONINI,  in the last 168 hours BNP: No components found with this basename: POCBNP,  CBG: No results found for this basename: GLUCAP,  in the last 168 hours   Micro Results: No results found for this or any previous visit (from the past 240 hour(s)).  Studies/Results: Dg Chest 2 View  05/06/2014   CLINICAL DATA:  Chest pain and emesis  EXAM: CHEST  2 VIEW  COMPARISON:  January 30, 2013  FINDINGS: There is no edema or consolidation. Heart size and pulmonary vascularity are normal. No adenopathy. No apparent pneumothorax. There is evidence of old trauma involving the left scapula. There is an old healed fracture of the anterior left fourth rib.  IMPRESSION: No edema or consolidation.   Electronically Signed   By: Bretta Bang M.D.   On: 05/06/2014 11:31   Ct Abdomen Pelvis W Contrast  05/06/2014   CLINICAL DATA:  Chest pain and emesis  EXAM: CT ABDOMEN AND PELVIS WITH CONTRAST  TECHNIQUE: Multidetector CT imaging of the abdomen and pelvis was performed using the standard protocol  following bolus administration of intravenous contrast.  CONTRAST:  OMNIPAQUE IOHEXOL 300 MG/ML  SOLN  COMPARISON:  01/26/2013  FINDINGS: BODY WALL: Unremarkable.  LOWER CHEST: There is marked circumferential low-density thickening of the esophagus. The mucosa appears avidly enhancing in this region. No evidence of mucosal based mass. There is surrounding periesophageal fluid, non loculated, which supports diagnosis of esophagitis. No lower pneumomediastinum. No lower mediastinal adenopathy. Small sliding-type hiatal hernia.  ABDOMEN/PELVIS:  Liver: No focal abnormality.  Biliary: No evidence of biliary  obstruction or stone.  Pancreas: Unremarkable.  Spleen: Unremarkable.  Adrenals: Unremarkable.  Kidneys and ureters: No hydronephrosis or stone.  Bladder: Unremarkable.  Reproductive: Unremarkable.  Bowel: No obstruction. Normal appendix.  Retroperitoneum: No mass or adenopathy.  Peritoneum: No ascites or pneumoperitoneum.  Vascular: No acute abnormality.  OSSEOUS: No acute abnormalities.  IMPRESSION: Severe distal esophagitis. Esophagram or endoscopy after treatment recommended to ensure normalization.   Electronically Signed   By: Tiburcio Pea M.D.   On: 05/06/2014 22:14    Medications: Scheduled Meds: . folic acid  1 mg Oral Daily  . heparin  5,000 Units Subcutaneous 3 times per day  . [START ON 05/08/2014] Influenza vac split quadrivalent PF  0.5 mL Intramuscular Tomorrow-1000  . LORazepam  0-4 mg Intravenous Q6H   Followed by  . [START ON 05/09/2014] LORazepam  0-4 mg Intravenous Q12H  . multivitamin with minerals  1 tablet Oral Daily  . nicotine  21 mg Transdermal Daily  . pantoprazole (PROTONIX) IV  40 mg Intravenous Q12H  . sodium chloride  3 mL Intravenous Q12H  . sucralfate  1 g Oral 4 times per day  . thiamine  100 mg Oral Daily   Or  . thiamine  100 mg Intravenous Daily      LOS: 1 day   Nyra Anspaugh M.D. [facility] 05/07/2014, 12:08 PM Pager: [phone]  If 7PM-7AM, please contact night-coverage [email or web address] Password [email or web address]

## 2014-05-07 NOTE — Telephone Encounter (Signed)
Message copied by Daphine Deutscher on Thu May 07, 2014  9:24 AM ------      Message from: Beverley Fiedler      Created: Wed May 06, 2014 11:12 PM      Regarding: new pt       Chest pain and esophagitis by CT seen in ED      Needs urgent office visit ASAP and then Endo      Thanks      Vonna Kotyk             ------

## 2014-05-07 NOTE — Progress Notes (Signed)
Report received from ED nurse. Patient arrived via ED bed. Patient VSS, no complaints of n/v. Comfortably resting in bed.

## 2014-05-07 NOTE — Telephone Encounter (Signed)
Patient was admitted to hospital

## 2014-05-07 NOTE — Progress Notes (Signed)
UR completed 

## 2014-05-08 ENCOUNTER — Encounter (HOSPITAL_COMMUNITY): Payer: Self-pay | Admitting: Physician Assistant

## 2014-05-08 ENCOUNTER — Other Ambulatory Visit: Payer: Self-pay | Admitting: Physician Assistant

## 2014-05-08 ENCOUNTER — Encounter: Payer: Self-pay | Admitting: Nurse Practitioner

## 2014-05-08 DIAGNOSIS — D500 Iron deficiency anemia secondary to blood loss (chronic): Secondary | ICD-10-CM

## 2014-05-08 DIAGNOSIS — K209 Esophagitis, unspecified without bleeding: Principal | ICD-10-CM

## 2014-05-08 DIAGNOSIS — F1010 Alcohol abuse, uncomplicated: Secondary | ICD-10-CM

## 2014-05-08 DIAGNOSIS — F17200 Nicotine dependence, unspecified, uncomplicated: Secondary | ICD-10-CM

## 2014-05-08 MED ORDER — PANTOPRAZOLE SODIUM 40 MG PO TBEC: 40.0000 mg | DELAYED_RELEASE_TABLET | Freq: Two times a day (BID) | ORAL | Status: DC

## 2014-05-08 MED ORDER — SUCRALFATE 1 G PO TABS: 1.0000 g | ORAL_TABLET | Freq: Three times a day (TID) | ORAL | Status: DC

## 2014-05-08 MED ORDER — OMEPRAZOLE 40 MG PO CPDR: 40.0000 mg | DELAYED_RELEASE_CAPSULE | Freq: Two times a day (BID) | ORAL | Status: DC

## 2014-05-08 MED ORDER — HYDROCODONE-ACETAMINOPHEN 7.5-325 MG PO TABS
1.0000 | ORAL_TABLET | Freq: Four times a day (QID) | ORAL | Status: DC | PRN
  Administered 2014-05-08 – 2014-05-09 (×5): 1 via ORAL
  Filled 2014-05-08 (×5): qty 1

## 2014-05-08 MED ORDER — OXYCODONE-ACETAMINOPHEN 5-325 MG PO TABS: 1.0000 | ORAL_TABLET | Freq: Three times a day (TID) | ORAL | Status: DC | PRN

## 2014-05-08 MED ORDER — PANTOPRAZOLE SODIUM 40 MG IV SOLR
40.0000 mg | Freq: Two times a day (BID) | INTRAVENOUS | Status: DC
  Administered 2014-05-08 – 2014-05-09 (×3): 40 mg via INTRAVENOUS
  Filled 2014-05-08 (×4): qty 40

## 2014-05-08 MED ORDER — PROMETHAZINE HCL 12.5 MG PO TABS: 12.5000 mg | ORAL_TABLET | Freq: Four times a day (QID) | ORAL | Status: DC | PRN

## 2014-05-08 NOTE — Care Management Note (Signed)
CARE MANAGEMENT NOTE 05/08/2014  Patient:  Aaron Daniel, Aaron Daniel   Account Number:  [id number]  Date Initiated:  05/08/2014  Documentation initiated by:  Vance Peper  Subjective/Objective Assessment:   53 yr old male admitted with epigastric pain.     Action/Plan:   Case manager received order to arrange for PCP. CM called [facility]. Patient will need to contact them on Monday 9/28 yto schedule appt for Wed9/30. CM will give this info to patient.   Anticipated DC Date:  05/08/2014   Anticipated DC Plan:  HOME/SELF CARE      DC Planning Services  CM consult  [facility]      [facility] Choice  NA   Choice offered to / List presented to:  NA   DME arranged  NA        HH arranged  NA      Status of service:  Completed, signed off Medicare Important Message given?   (If response is "NO", the following Medicare IM given date fields will be blank) Date Medicare IM given:   Medicare IM given by:   Date Additional Medicare IM given:   Additional Medicare IM given by:    Discharge Disposition:  HOME/SELF CARE  Per UR Regulation:  Reviewed for med. necessity/level of care/duration of stay

## 2014-05-08 NOTE — Care Management Note (Signed)
CARE MANAGEMENT NOTE 05/08/2014  Patient:  Aaron Daniel, Aaron Daniel   Account Number:  [id number]  Date Initiated:  05/08/2014  Documentation initiated by:  Vance Peper  Subjective/Objective Assessment:   53 yr old male admitted with epigastric pain.     Action/Plan:   Case manager received order to arrange for PCP. CM called [facility]. Patient will need to contact them on Monday 9/28 yto schedule appt for Wed9/30. CM will give this info to patient.   Anticipated DC Date:  05/08/2014   Anticipated DC Plan:  HOME/SELF CARE      DC Planning Services  CM consult  [facility]  [facility] Program      [facility] Choice  NA   Choice offered to / List presented to:  NA   DME arranged  NA        HH arranged  NA      Status of service:  Completed, signed off Medicare Important Message given?   (If response is "NO", the following Medicare IM given date fields will be blank) Date Medicare IM given:   Medicare IM given by:   Date Additional Medicare IM given:   Additional Medicare IM given by:    Discharge Disposition:  HOME/SELF CARE  Per UR Regulation:  Reviewed for med. necessity/level of care/duration of stay  If discussed at Long Length of Stay Meetings, dates discussed:    Comments:  05/08/14 1615 Vance Peper, RN BSN Case Manager Case manager provided patient with Match assistance paper work. Provided him with cost for Vicodin should he discharge on this. Norco is being used per pharmacy Tech at [facility] 5/325 --$26.51 for 30 tabs, Norco 10/325 --$20.41

## 2014-05-08 NOTE — Progress Notes (Signed)
Patient ID: Aaron Daniel  male  [id number]    DOB: March 25, 1961    DOA: 05/06/2014  PCP: No PCP Per Patient  Assessment/Plan: Principal Problem:   Esophagitis with acute lower chest/epigastric pain - CT abdomen and pelvis showed severe distal esophagitis recommending EGD - Continue PPI twice a day, placed on Carafate, oral narcotics by GI - Outpatient EGD in 4 weeks   Active Problems:   Tobacco abuse - Patient counseled strongly, place on nicotine patch    Alcohol abuse - Continue CIWA scale with Ativan   DVT Prophylaxis: SCD's  Code Status: Full code  Family Communication:  Disposition: Hopefully DC home in a.m. if symptoms are improved  Consultants:  Gastroenterology, Dr. Christella Hartigan  Procedures:  None  Antibiotics:  None    Subjective: Patient seen examined, feeling better, no nausea, vomiting, chest pain. Abdominal pain improving  Objective: Weight change:   Intake/Output Summary (Last 24 hours) at 05/08/14 1152 Last data filed at 05/08/14 0900  Gross per 24 hour  Intake    200 ml  Output      0 ml  Net    200 ml   Blood pressure 116/60, pulse 93, temperature 98.3 F (36.8 C), temperature source Oral, resp. rate 16, height  (1.676 m), weight 63.504 kg (140 lb), SpO2 97.00%.  Physical Exam: General: Alert and awake, oriented x3, not in any acute distress. CVS: S1-S2 clear, no murmur rubs or gallops Chest: clear to auscultation bilaterally, no wheezing, rales or rhonchi Abdomen: soft mild epigastric tenderness to deep palpation, nondistended, normal bowel sounds  Extremities: no cyanosis, clubbing or edema noted bilaterally  Lab Results: Basic Metabolic Panel:  Recent Labs Lab 05/06/14 1057 05/07/14 0300  NA 140 139  K 3.7 4.0  CL 100 103  CO2 29 28  GLUCOSE 135* 84  BUN 6 5*  CREATININE 0.90 0.99  CALCIUM 9.1 7.8*   Liver Function Tests:  Recent Labs Lab 05/06/14 1645 05/07/14 0300  AST 15 13  ALT 8 7  ALKPHOS 62 51    BILITOT 0.3 0.4  PROT 7.0 5.5*  ALBUMIN 3.3* 2.6*    Recent Labs Lab 05/06/14 1645  LIPASE 34   No results found for this basename: AMMONIA,  in the last 168 hours CBC:  Recent Labs Lab 05/06/14 1057  WBC 15.8*  HGB 14.7  HCT 43.9  MCV 89.2  PLT 250   Cardiac Enzymes: No results found for this basename: CKTOTAL, CKMB, CKMBINDEX, TROPONINI,  in the last 168 hours BNP: No components found with this basename: POCBNP,  CBG: No results found for this basename: GLUCAP,  in the last 168 hours   Micro Results: Recent Results (from the past 240 hour(s))  URINE CULTURE     Status: None   Collection Time    05/06/14  5:26 PM      Result Value Ref Range Status   Specimen Description URINE, RANDOM   Final   Special Requests NONE   Final   Culture  Setup Time     Final   Value: 05/06/2014 23:39     Performed at [facility] Count     Final   Value: NO GROWTH     Performed at [facility]   Culture     Final   Value: NO GROWTH     Performed at [facility]   Report Status 05/07/2014 FINAL   Final    Studies/Results: Dg Chest  2 View  05/06/2014   CLINICAL DATA:  Chest pain and emesis  EXAM: CHEST  2 VIEW  COMPARISON:  January 30, 2013  FINDINGS: There is no edema or consolidation. Heart size and pulmonary vascularity are normal. No adenopathy. No apparent pneumothorax. There is evidence of old trauma involving the left scapula. There is an old healed fracture of the anterior left fourth rib.  IMPRESSION: No edema or consolidation.   Electronically Signed   By: Bretta Bang M.D.   On: 05/06/2014 11:31   Ct Abdomen Pelvis W Contrast  05/06/2014   CLINICAL DATA:  Chest pain and emesis  EXAM: CT ABDOMEN AND PELVIS WITH CONTRAST  TECHNIQUE: Multidetector CT imaging of the abdomen and pelvis was performed using the standard protocol following bolus administration of intravenous contrast.  CONTRAST:  OMNIPAQUE IOHEXOL 300 MG/ML  SOLN   COMPARISON:  01/26/2013  FINDINGS: BODY WALL: Unremarkable.  LOWER CHEST: There is marked circumferential low-density thickening of the esophagus. The mucosa appears avidly enhancing in this region. No evidence of mucosal based mass. There is surrounding periesophageal fluid, non loculated, which supports diagnosis of esophagitis. No lower pneumomediastinum. No lower mediastinal adenopathy. Small sliding-type hiatal hernia.  ABDOMEN/PELVIS:  Liver: No focal abnormality.  Biliary: No evidence of biliary obstruction or stone.  Pancreas: Unremarkable.  Spleen: Unremarkable.  Adrenals: Unremarkable.  Kidneys and ureters: No hydronephrosis or stone.  Bladder: Unremarkable.  Reproductive: Unremarkable.  Bowel: No obstruction. Normal appendix.  Retroperitoneum: No mass or adenopathy.  Peritoneum: No ascites or pneumoperitoneum.  Vascular: No acute abnormality.  OSSEOUS: No acute abnormalities.  IMPRESSION: Severe distal esophagitis. Esophagram or endoscopy after treatment recommended to ensure normalization.   Electronically Signed   By: Tiburcio Pea M.D.   On: 05/06/2014 22:14    Medications: Scheduled Meds: . folic acid  1 mg Oral Daily  . LORazepam  0-4 mg Intravenous Q6H   Followed by  . [START ON 05/09/2014] LORazepam  0-4 mg Intravenous Q12H  . multivitamin with minerals  1 tablet Oral Daily  . nicotine  21 mg Transdermal Daily  . pantoprazole (PROTONIX) IV  40 mg Intravenous Q12H  . sodium chloride  3 mL Intravenous Q12H  . sucralfate  1 g Oral 4 times per day  . thiamine  100 mg Oral Daily   Or  . thiamine  100 mg Intravenous Daily      LOS: 2 days   RAI,RIPUDEEP M.D. [facility] 05/08/2014, 11:52 AM Pager: [phone]  If 7PM-7AM, please contact night-coverage [email or web address] Password [email or web address]

## 2014-05-08 NOTE — Consult Note (Signed)
[facility] Consult: 8:46 AM 05/08/2014  LOS: 2 days    Referring Provider: Dr Isidoro Donning  Primary Care Physician:  None Primary Gastroenterologist:  Gentry Fitz     Reason for Consultation:  Chest pain, n/v and esophagitis on CT   HPI: Aaron Daniel is a 53 y.o. male.  Generally healthy and no digestive issues. Starting Monday 9/21 after eating lunch of [employer] leftovers (refrigerated since cooked over the weekend), he had sudden onset of N/V.  Finished out work that afternoon.  Recurrent N/V, non-bloody or CG, that evening and into the next morning.  At same time began epigastric and chest pain, quickly intensified, worse with deep breathing.  No relief after Tums and alka seltzer (generally not using NSAIDs/ASA).  Unable to keep down liquids/solids.  Worked a few hours on Wednesday but pain severe.  In afternoon came to ED.    CT scan shows distal esophagitis.  WBCs 15.8.  LFTs normal. FOBT negative. coags normal.  Has not vomited since admission and chest pain is less frequent but still intense.  Episode pain triggered by eatng small amount of Chik Filet sandwich last night.   Currently NPO.  Pt denies previous dysphagia, weight loss, melena, diarrhea.  He endorses 6 pack daily  [employer] M-F, 12 pack per day Sat-Sun.  Also smokes pot and occasionally snorts cocaine on weekends.  UDS is + for multiple substances. Tested for Hepatitis and HIV while in prison ~2008, never advised he was positive for anything. HIV testing on 05/07/14 is also negative.     Past Medical History  Diagnosis Date  . Medical history non-contributory     Past Surgical History  Procedure Laterality Date  . Chest tube insertion Left 2014    Prior to Admission medications   Not on File    Scheduled Meds: . folic acid  1 mg  Oral Daily  . Influenza vac split quadrivalent PF  0.5 mL Intramuscular Tomorrow-1000  . LORazepam  0-4 mg Intravenous Q6H   Followed by  . [START ON 05/09/2014] LORazepam  0-4 mg Intravenous Q12H  . multivitamin with minerals  1 tablet Oral Daily  . nicotine  21 mg Transdermal Daily  . pantoprazole (PROTONIX) IV  40 mg Intravenous Q12H  . sodium chloride  3 mL Intravenous Q12H  . sucralfate  1 g Oral 4 times per day  . thiamine  100 mg Oral Daily   Or  . thiamine  100 mg Intravenous Daily   Infusions:   PRN Meds: sodium chloride, morphine injection, ondansetron (ZOFRAN) IV, ondansetron, sodium chloride   Allergies as of 05/06/2014 - Review Complete 05/06/2014  Allergen Reaction Noted  . Bee venom Swelling 05/06/2014    Family History  Problem Relation Age of Onset  . Hypertension Mother     History   Social History  . Marital Status: Divorced    Spouse Name: N/A    Number of Children: N/A  . Years of Education: N/A   Occupational History  . Not on file.   Social History Main Topics  .  Smoking status: Current Every Day Smoker -- 1.00 packs/day for 35 years    Types: Cigarettes  . Smokeless tobacco: Never Used  . Alcohol Use: 25.2 oz/week    42 Cans of beer per week     Comment: 05/07/2014 "drink 1 6 pack of beer qd"  . Drug Use: Yes    Special: Marijuana     Comment: 05/07/2014 "smoke marijuana a couple times/wk"  . Sexual Activity: Not Currently   Other Topics Concern  . Not on file   Social History Narrative   As of 04/2014 admission: released from prison in 2009.  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    6 pack beer per day, 12 pack per day on weekends.     REVIEW OF SYSTEMS: Constitutional:  No  Weakness, no weight fluctuation ENT:  No nose bleeds Pulm:  No  Sob, occasional cough CV:  No palpitations, no LE edema.  GU:  No hematuria, no frequency GI:  Per HPI Heme:  No unusual  bleeding or bruising   Transfusions:  none Neuro:  No headaches, no peripheral tingling or numbness Derm:  No itching, no rash or sores.  Endocrine:  No sweats or chills.  No polyuria or dysuria Immunization:  Does not think he was ever vaccinated for Hep A/B Travel:  None beyond local counties in last few months.    PHYSICAL EXAM: Vital signs in last 24 hours: Filed Vitals:   05/08/14 0500  BP: 100/59  Pulse: 87  Temp: 98.2 F (36.8 C)  Resp: 16   Wt Readings from Last 3 Encounters:  05/06/14 63.504 kg (140 lb)  01/27/13 68.402 kg (150 lb 12.8 oz)   General: pleasant, cooperative, comfortable.   Head:  No asymmetry or trauma  Eyes:  No icterus or pallor Ears:  Not HOH  Nose:  No congestion or discharge Mouth:  Clear, moist MM. Neck:  No JVD, no TMG Lungs:  Clear bil.  Voice hoarse, no cough Heart: RRR Abdomen:  Soft, thin, fullness and minor tenderness in left abdomen.  No guard or rebound.  Active BS.   Rectal: deferred   Musc/Skeltl: no joint swelling or deformity Extremities:  No CCE  Neurologic:  No tremor, no limb weakness, oriented x 3.  Good historian Skin:  Tanned, no sores or rash.  No telangectasia Tattoos:  On arms Nodes:  No cervical adenopathy   Psych:  Cooperative, relaxed, pleasant.   Intake/Output from previous day: 09/24 0701 - 09/25 0700 In: 200 [I.V.:200] Out: -  Intake/Output this shift:    LAB RESULTS:  Recent Labs  05/06/14 1057  WBC 15.8*  HGB 14.7  HCT 43.9  PLT 250  MCV    89 BMET Lab Results  Component Value Date   NA 139 05/07/2014   NA 140 05/06/2014   NA 136 01/27/2013   K 4.0 05/07/2014   K 3.7 05/06/2014   K 4.4 01/27/2013   CL 103 05/07/2014   CL 100 05/06/2014   CL 102 01/27/2013   CO2 28 05/07/2014   CO2 29 05/06/2014   CO2 25 01/27/2013   GLUCOSE 84 05/07/2014   GLUCOSE 135* 05/06/2014   GLUCOSE 111* 01/27/2013   BUN 5* 05/07/2014   BUN 6 05/06/2014   BUN 11 01/27/2013   CREATININE 0.99 05/07/2014   CREATININE 0.90  05/06/2014   CREATININE 0.93 01/27/2013   CALCIUM 7.8* 05/07/2014   CALCIUM 9.1  05/06/2014   CALCIUM 8.1* 01/27/2013   LFT  Recent Labs  05/06/14 1645 05/07/14 0300  PROT 7.0 5.5*  ALBUMIN 3.3* 2.6*  AST 15 13  ALT 8 7  ALKPHOS 62 51  BILITOT 0.3 0.4  BILIDIR <0.2  --   IBILI NOT CALCULATED  --    PT/INR Lab Results  Component Value Date   INR 1.03 05/07/2014   Hepatitis Panel No results found for this basename: HEPBSAG, HCVAB, HEPAIGM, HEPBIGM,  in the last 72 hours Lipase     Component Value Date/Time   LIPASE 34 05/06/2014 1645    Drugs of Abuse     Component Value Date/Time   LABOPIA POSITIVE* 05/06/2014 1726   COCAINSCRNUR POSITIVE* 05/06/2014 1726   LABBENZ NONE DETECTED 05/06/2014 1726   AMPHETMU NONE DETECTED 05/06/2014 1726   THCU POSITIVE* 05/06/2014 1726   LABBARB NONE DETECTED 05/06/2014 1726     RADIOLOGY STUDIES: Dg Chest 2 View 05/06/2014   CLINICAL DATA:  Chest pain and emesis  EXAM: CHEST  2 VIEW  COMPARISON:  January 30, 2013  FINDINGS: There is no edema or consolidation. Heart size and pulmonary vascularity are normal. No adenopathy. No apparent pneumothorax. There is evidence of old trauma involving the left scapula. There is an old healed fracture of the anterior left fourth rib.  IMPRESSION: No edema or consolidation.   Electronically Signed   By: Bretta Bang M.D.   On: 05/06/2014 11:31   Ct Abdomen Pelvis W Contrast 05/06/2014    COMPARISON:  01/26/2013  FINDINGS: BODY WALL: Unremarkable.  LOWER CHEST: There is marked circumferential low-density thickening of the esophagus. The mucosa appears avidly enhancing in this region. No evidence of mucosal based mass. There is surrounding periesophageal fluid, non loculated, which supports diagnosis of esophagitis. No lower pneumomediastinum. No lower mediastinal adenopathy. Small sliding-type hiatal hernia.  ABDOMEN/PELVIS:  Liver: No focal abnormality.  Biliary: No evidence of biliary obstruction or stone.   Pancreas: Unremarkable.  Spleen: Unremarkable.  Adrenals: Unremarkable.  Kidneys and ureters: No hydronephrosis or stone.  Bladder: Unremarkable.  Reproductive: Unremarkable.  Bowel: No obstruction. Normal appendix.  Retroperitoneum: No mass or adenopathy.  Peritoneum: No ascites or pneumoperitoneum.  Vascular: No acute abnormality.  OSSEOUS: No acute abnormalities.  IMPRESSION: Severe distal esophagitis. Esophagram or endoscopy after treatment recommended to ensure normalization.   Electronically Signed   By: Tiburcio Pea M.D.   On: 05/06/2014 22:14    ENDOSCOPIC STUDIES: Non ever  IMPRESSION:   *  Esophagitis.  Acutely symptomatic starting this week.   *  UDS + for multiple drugs  *  Regular ETOH abuse.  LFTs normal.     PLAN:     *  EGD?  Will d/w Dr Christella Hartigan. Not now.  Will let him go home on BID PPI and has GI ROV for 10/21 at 10 AM.  Can decode then about pursuing EGD.  Pt agreeable.  He has decided to quit drinking ETOH.  This was encouraged as a wise decision.  BID Omeprazole 40 at discharge, Carafate probably unnecessary but would give limited 10 - 15 # of a low dose narcotic for chest pain which ought to subside as PPI does its job.  Full liquids. Advance as tolerated.    Jennye Moccasin  05/08/2014, 8:46 AM Pager: [phone]     ________________________________________________________________________  [facility] MD note:  I personally examined the patient, reviewed the data and agree with the assessment and plan described above.  Clinical severe esophagitis  and CT points to that as well.  He is still requiring IV pain meds at time but want to try to transition to oral today.  Will restart diet. Continue IV PPI twice daily until he is ready for d/c (hopefully tomorrow).  He understands he absolutely must stay on prescription strength PPI twice daily after d/c (20-30 min prior to BF and dinner meals). He understands that caffeine and etoh will worsen GERD and will do his best to  avoid. Rov in my office in 4-5 weeks (appt already set).  Will likely proceed with EGD from there to check for esophagitis healing and also to rule out neoplasm, Barrett's changes.  He may need limited oral narcotic prescription at d/c for the pains as well.    Rob Bunting, MD [facility] Pager [phone]

## 2014-05-09 MED ORDER — HYDROCODONE-ACETAMINOPHEN 7.5-325 MG PO TABS: 1.0000 | ORAL_TABLET | Freq: Four times a day (QID) | ORAL | Status: DC | PRN

## 2014-05-09 MED ORDER — PROMETHAZINE HCL 12.5 MG PO TABS: 12.5000 mg | ORAL_TABLET | Freq: Four times a day (QID) | ORAL | Status: DC | PRN

## 2014-05-09 NOTE — Progress Notes (Signed)
Discharge instructions and prescriptions reviewed with patient. Patient denies questions or concerns at this time. IV removed with no complications. Vital signs stable. Patient discharged via wheelchair with personal belongings, prescriptions and discharge packet.

## 2014-05-09 NOTE — ED Provider Notes (Signed)
Medical screening examination/treatment/procedure(s) were conducted as a shared visit with non-physician practitioner(s) and myself.  I personally evaluated the patient during the encounter.   EKG Interpretation None       Briefly, pt is a 53 y.o. male presenting with epigastric abd pain with ho etoh abuse.  Labs obtained and unremarkable for acute etiology to explain symptoms, however he had persistent abd pain and guarding on examination.  CT scan obtained and demonstrated severe esophagitis, so GI was called and pt admitted for pain control and further wu.  I performed an examination on the patient including cardiac, pulmonary, and gi systems which were remarkable for epigastric tenderness.     Mirian Mo, MD 05/09/14 [phone]

## 2014-05-09 NOTE — Progress Notes (Signed)
[facility] Progress Note    Since last GI note: Chest pains with eating is much improved but not gone completely  Objective: Vital signs in last 24 hours: Temp:  [97.9 F (36.6 C)-98.3 F (36.8 C)] 97.9 F (36.6 C) (09/26 0533) Pulse Rate:  [67-93] 67 (09/26 0533) Resp:  [16-18] 18 (09/25 2059) BP: (116-131)/(60-75) 121/72 mmHg (09/26 0533) SpO2:  [97 %-99 %] 99 % (09/26 0533) Last BM Date: 05/08/14 General: alert and oriented times 3 Heart: regular rate and rythm Abdomen: soft, non-tender, non-distended, normal bowel sounds   Lab Results:  Recent Labs  05/06/14 1057  WBC 15.8*  HGB 14.7  PLT 250  MCV 89.2    Recent Labs  05/06/14 1057 05/07/14 0300  NA 140 139  K 3.7 4.0  CL 100 103  CO2 29 28  GLUCOSE 135* 84  BUN 6 5*  CREATININE 0.90 0.99  CALCIUM 9.1 7.8*    Recent Labs  05/06/14 1645 05/07/14 0300  PROT 7.0 5.5*  ALBUMIN 3.3* 2.6*  AST 15 13  ALT 8 7  ALKPHOS 62 51  BILITOT 0.3 0.4  BILIDIR <0.2  --   IBILI NOT CALCULATED  --     Recent Labs  05/07/14 0240  INR 1.03     Studies/Results: No results found.   Medications: Scheduled Meds: . folic acid  1 mg Oral Daily  . LORazepam  0-4 mg Intravenous Q12H  . multivitamin with minerals  1 tablet Oral Daily  . nicotine  21 mg Transdermal Daily  . pantoprazole (PROTONIX) IV  40 mg Intravenous Q12H  . sodium chloride  3 mL Intravenous Q12H  . sucralfate  1 g Oral 4 times per day  . thiamine  100 mg Oral Daily   Or  . thiamine  100 mg Intravenous Daily   Continuous Infusions:  PRN Meds:.sodium chloride, HYDROcodone-acetaminophen, morphine injection, ondansetron (ZOFRAN) IV, ondansetron, sodium chloride    Assessment/Plan: 53 y.o. male severe GERD, esophagitis  OK to d/c home today with PPI BID 20-30 min prior to BF and dinner meals.  Avoid Etoh, too much caffeine.  He will probably need limited narcotic script (20-30 pills) as the esophagitis heals. Appt already  set up to see [facility] in our office in 3-4 weeks.      Rachael Fee, MD  05/09/2014, 9:21 AM [facility] Pager [phone]

## 2014-05-09 NOTE — Discharge Summary (Signed)
Physician Discharge Summary  Patient ID: Aaron Daniel MRN: [id number] DOB/AGE: [birth year] 53 y.o.  Admit date: 05/06/2014 Discharge date: 05/09/2014  Primary Care Physician:  No PCP Per Patient  Discharge Diagnoses:    . severe Esophagitis . Tobacco abuse . Alcohol abuse  Consults:  Gastroenterology, Dr. Christella Hartigan   Recommendations for Outpatient Follow-up:  Patient was started on PPI, Carafate, has GI followup arranged, will likely need endoscopy.  Allergies:   Allergies  Allergen Reactions  . Bee Venom Swelling     Discharge Medications:   Medication List         HYDROcodone-acetaminophen 7.5-325 MG per tablet  Commonly known as:  NORCO  Take 1 tablet by mouth every 6 (six) hours as needed for moderate pain.     omeprazole 40 MG capsule  Commonly known as:  PRILOSEC  Take 1 capsule (40 mg total) by mouth 2 (two) times daily.     oxyCODONE-acetaminophen 5-325 MG per tablet  Commonly known as:  ROXICET  Take 1 tablet by mouth every 8 (eight) hours as needed for severe pain.     promethazine 12.5 MG tablet  Commonly known as:  PHENERGAN  Take 1 tablet (12.5 mg total) by mouth every 6 (six) hours as needed for nausea or vomiting.     sucralfate 1 G tablet  Commonly known as:  CARAFATE  Take 1 tablet (1 g total) by mouth 4 (four) times daily -  with meals and at bedtime.         Brief H and P: For complete details please refer to admission H and P, but in brief Aaron Daniel is a 53 y.o. male is a past medical history of tobacco abuse, alcohol abuse, who presented with epigastric pain.  Patient is everyday drinker. He drinks 6 pack per day regularly. Last drink was 3 days ago. He started having epigastric pain 3 days ago, located at the epigastric area constant, with intermittent accentuation, nonradiating, sharp and burning pain. It is aggravated by eating or deep breath. It is associated with nausea and vomiting. He vomited approximately 3-4 times a day with no  hematemesis, fevers or chills. CT abdomen done in ED, showed severe distal esophagitis. Troponin negative   Hospital Course:  Esophagitis with acute lower chest/epigastric pain: patient was admitted for further workup. CT abdomen and pelvis showed severe distal esophagitis. Gastroenterology was consulted, patient was placed on PPI twice a day, Carafate, oral narcotics as needed for severe pain. Per oncology, Dr. Christella Hartigan, outpatient GI followup has been arranged, patient will need endoscopy. Patient was strongly counseled to quit smoking, avoid caffeine and alcohol.  Tobacco abuse: patient was counseled strongly on nicotine cessation  Alcohol abuse : Patient was placed on CIWA protocol with Ativan, he did not have acute alcohol withdrawals     Day of Discharge BP 121/72  Pulse 67  Temp(Src) 97.9 F (36.6 C) (Oral)  Resp 18  Ht  (1.676 m)  Wt 63.504 kg (140 lb)  BMI 22.61 kg/m2  SpO2 99%  Physical Exam: General: Alert and awake oriented x3 not in any acute distress. CVS: S1-S2 clear no murmur rubs or gallops Chest: clear to auscultation bilaterally, no wheezing rales or rhonchi Abdomen: soft nontender, nondistended, normal bowel sounds Extremities: no cyanosis, clubbing or edema noted bilaterally Neuro: Cranial nerves II-XII intact, no focal neurological deficits   The results of significant diagnostics from this hospitalization (including imaging, microbiology, ancillary and laboratory) are listed below for reference.  LAB RESULTS: Basic Metabolic Panel:  Recent Labs Lab 05/06/14 1057 05/07/14 0300  NA 140 139  K 3.7 4.0  CL 100 103  CO2 29 28  GLUCOSE 135* 84  BUN 6 5*  CREATININE 0.90 0.99  CALCIUM 9.1 7.8*   Liver Function Tests:  Recent Labs Lab 05/06/14 1645 05/07/14 0300  AST 15 13  ALT 8 7  ALKPHOS 62 51  BILITOT 0.3 0.4  PROT 7.0 5.5*  ALBUMIN 3.3* 2.6*    Recent Labs Lab 05/06/14 1645  LIPASE 34   No results found for this basename:  AMMONIA,  in the last 168 hours CBC:  Recent Labs Lab 05/06/14 1057  WBC 15.8*  HGB 14.7  HCT 43.9  MCV 89.2  PLT 250   Cardiac Enzymes: No results found for this basename: CKTOTAL, CKMB, CKMBINDEX, TROPONINI,  in the last 168 hours BNP: No components found with this basename: POCBNP,  CBG: No results found for this basename: GLUCAP,  in the last 168 hours  Significant Diagnostic Studies:  Dg Chest 2 View  05/06/2014   CLINICAL DATA:  Chest pain and emesis  EXAM: CHEST  2 VIEW  COMPARISON:  January 30, 2013  FINDINGS: There is no edema or consolidation. Heart size and pulmonary vascularity are normal. No adenopathy. No apparent pneumothorax. There is evidence of old trauma involving the left scapula. There is an old healed fracture of the anterior left fourth rib.  IMPRESSION: No edema or consolidation.   Electronically Signed   By: Bretta Bang M.D.   On: 05/06/2014 11:31   Ct Abdomen Pelvis W Contrast  05/06/2014   CLINICAL DATA:  Chest pain and emesis  EXAM: CT ABDOMEN AND PELVIS WITH CONTRAST  TECHNIQUE: Multidetector CT imaging of the abdomen and pelvis was performed using the standard protocol following bolus administration of intravenous contrast.  CONTRAST:  OMNIPAQUE IOHEXOL 300 MG/ML  SOLN  COMPARISON:  01/26/2013  FINDINGS: BODY WALL: Unremarkable.  LOWER CHEST: There is marked circumferential low-density thickening of the esophagus. The mucosa appears avidly enhancing in this region. No evidence of mucosal based mass. There is surrounding periesophageal fluid, non loculated, which supports diagnosis of esophagitis. No lower pneumomediastinum. No lower mediastinal adenopathy. Small sliding-type hiatal hernia.  ABDOMEN/PELVIS:  Liver: No focal abnormality.  Biliary: No evidence of biliary obstruction or stone.  Pancreas: Unremarkable.  Spleen: Unremarkable.  Adrenals: Unremarkable.  Kidneys and ureters: No hydronephrosis or stone.  Bladder: Unremarkable.  Reproductive:  Unremarkable.  Bowel: No obstruction. Normal appendix.  Retroperitoneum: No mass or adenopathy.  Peritoneum: No ascites or pneumoperitoneum.  Vascular: No acute abnormality.  OSSEOUS: No acute abnormalities.  IMPRESSION: Severe distal esophagitis. Esophagram or endoscopy after treatment recommended to ensure normalization.   Electronically Signed   By: Tiburcio Pea M.D.   On: 05/06/2014 22:14       Disposition and Follow-up: Discharge Instructions   Diet - low sodium heart healthy    Complete by:  As directed      Discharge instructions    Complete by:  As directed   Please avoid fried food, spicy food, caffeine, alcohol.     Increase activity slowly    Complete by:  As directed             DISPOSITION: Home   DIET:Heart healthy diet    DISCHARGE FOLLOW-UP Follow-up Information   Follow up with  [facility]    . (Mr. Gellner will need to call [phone] on  Monday 05/11/14 to schedule an appointment for [facility], 05/13/14)    Contact information:   [address] [place] [address] [phone]      Follow up with Willette Cluster, NP On 06/03/2014. (at 10:00 AM, for hospital follow-up)    Specialty:  Nurse Practitioner   Contact information:   [address] [place] [address] [phone]       Time spent on Discharge: 40 mins  Signed:   Esaul Dorwart M.D. [facility] 05/09/2014, 10:06 AM Pager: [phone]

## 2014-05-20 ENCOUNTER — Ambulatory Visit: Payer: Self-pay | Admitting: Internal Medicine

## 2014-06-03 ENCOUNTER — Encounter: Payer: Self-pay | Admitting: Nurse Practitioner

## 2014-06-03 ENCOUNTER — Ambulatory Visit (INDEPENDENT_AMBULATORY_CARE_PROVIDER_SITE_OTHER): Payer: Self-pay | Admitting: Nurse Practitioner

## 2014-06-03 VITALS — BP 124/80 | HR 93 | Ht 65.0 in | Wt 130.2 lb

## 2014-06-03 DIAGNOSIS — K209 Esophagitis, unspecified without bleeding: Secondary | ICD-10-CM

## 2014-06-03 DIAGNOSIS — K2100 Gastro-esophageal reflux disease with esophagitis, without bleeding: Secondary | ICD-10-CM

## 2014-06-03 MED ORDER — OMEPRAZOLE 40 MG PO CPDR: 40.0000 mg | DELAYED_RELEASE_CAPSULE | Freq: Two times a day (BID) | ORAL | Status: DC

## 2014-06-03 MED ORDER — SUCRALFATE 1 G PO TABS: 1.0000 g | ORAL_TABLET | Freq: Three times a day (TID) | ORAL | Status: DC

## 2014-06-03 NOTE — Patient Instructions (Addendum)
You have been scheduled for an endoscopy. Please follow written instructions given to you at your visit today. If you use inhalers (even only as needed), please bring them with you on the day of your procedure. Your physician has requested that you go to www.startemmi.com and enter the access code given to you at your visit today. This web site gives a general overview about your procedure. However, you should still follow specific instructions given to you by our office regarding your preparation for the procedure.   You have been given a separate informational sheet regarding your tobacco use, the importance of quitting and local resources to help you quit.  Today you have been given printed rx's for Prilosec and Carafate.  If the Prilosec is too expensive try the over the counter Prilosec.  If the Carafate is too expensive you may try and get by without it per Willette ClusterPaula Guenther NP.

## 2014-06-03 NOTE — Progress Notes (Signed)
HPI :   Patient is a 53 year old male who we met in the hospital late last month. Patient presented to the hospital with chest pain. CT scan showed marked circumferential thickening of the esophagus with periesophageal fluid. Findings felt to be reflection of severe distal esophagitis. Patient has no significant history of GERD symptoms. Chest pain had only started about one week prior to ED visit.  In the hospital Dr. Ardis Hughs started patient on twice daily PPI with plans for outpatient upper endoscopy for further evaluation. Patient is in today with his mother. He does feel better, having only occasional chest discomfort. No odynophagia. No dysphagia. His weight is stable. Patient continues to smoke. He has cut back on alcohol intake.   Past Medical History  Diagnosis Date  . Medical history non-contributory     Family History  Problem Relation Age of Onset  . Hypertension Mother    History  Substance Use Topics  . Smoking status: Current Every Day Smoker -- 1.00 packs/day for 35 years    Types: Cigarettes  . Smokeless tobacco: Never Used  . Alcohol Use: 25.2 oz/week    42 Cans of beer per week     Comment: 05/07/2014 "drink 1 6 pack of beer qd"   Current Outpatient Prescriptions  Medication Sig Dispense Refill  . HYDROcodone-acetaminophen (NORCO) 7.5-325 MG per tablet Take 1 tablet by mouth every 6 (six) hours as needed for moderate pain.  30 tablet  0  . omeprazole (PRILOSEC) 40 MG capsule Take 1 capsule (40 mg total) by mouth 2 (two) times daily.  60 capsule  3  . oxyCODONE-acetaminophen (ROXICET) 5-325 MG per tablet Take 1 tablet by mouth every 8 (eight) hours as needed for severe pain.  30 tablet  0  . promethazine (PHENERGAN) 12.5 MG tablet Take 1 tablet (12.5 mg total) by mouth every 6 (six) hours as needed for nausea or vomiting.  30 tablet  0  . sucralfate (CARAFATE) 1 G tablet Take 1 tablet (1 g total) by mouth 4 (four) times daily -  with meals and at bedtime.  120 tablet   3   No current facility-administered medications for this visit.   Allergies  Allergen Reactions  . Bee Venom Swelling     Review of Systems: Positive for back pain, fever, headache and swelling of feet/legs All other systems reviewed and negative except where noted in HPI.   Ct Abdomen Pelvis W Contrast  05/06/2014   CLINICAL DATA:  Chest pain and emesis  EXAM: CT ABDOMEN AND PELVIS WITH CONTRAST  TECHNIQUE: Multidetector CT imaging of the abdomen and pelvis was performed using the standard protocol following bolus administration of intravenous contrast.  CONTRAST:  151m OMNIPAQUE IOHEXOL 300 MG/ML  SOLN  COMPARISON:  01/26/2013  FINDINGS: BODY WALL: Unremarkable.  LOWER CHEST: There is marked circumferential low-density thickening of the esophagus. The mucosa appears avidly enhancing in this region. No evidence of mucosal based mass. There is surrounding periesophageal fluid, non loculated, which supports diagnosis of esophagitis. No lower pneumomediastinum. No lower mediastinal adenopathy. Small sliding-type hiatal hernia.  ABDOMEN/PELVIS:  Liver: No focal abnormality.  Biliary: No evidence of biliary obstruction or stone.  Pancreas: Unremarkable.  Spleen: Unremarkable.  Adrenals: Unremarkable.  Kidneys and ureters: No hydronephrosis or stone.  Bladder: Unremarkable.  Reproductive: Unremarkable.  Bowel: No obstruction. Normal appendix.  Retroperitoneum: No mass or adenopathy.  Peritoneum: No ascites or pneumoperitoneum.  Vascular: No acute abnormality.  OSSEOUS: No acute abnormalities.  IMPRESSION: Severe distal  esophagitis. Esophagram or endoscopy after treatment recommended to ensure normalization.   Electronically Signed   By: Jorje Guild M.D.   On: 05/06/2014 22:14    Physical Exam: BP 124/80  Pulse 93  Ht _0  (1.651 m)  Wt 130 lb 3.2 oz (59.058 kg)  BMI 21.67 kg/m2  SpO2 98% Constitutional: Pleasant,well-developed, white male in no acute distress. HEENT: Normocephalic and  atraumatic. Conjunctivae are normal. No scleral icterus. Neck supple.  Cardiovascular: Normal rate, regular rhythm.  Pulmonary/chest: Effort normal and breath sounds normal. No wheezing, rales or rhonchi. Abdominal: Soft, nondistended, nontender. Bowel sounds active throughout. There are no masses palpable. No hepatomegaly. Extremities: no edema Lymphadenopathy: No cervical adenopathy noted. Neurological: Alert and oriented to person place and time. Skin: Skin is warm and dry. No rashes noted. Psychiatric: Normal mood and affect. Behavior is normal.   ASSESSMENT AND PLAN:  74. 53 year old male with recent admission to the hospital for chest pain. CT scan of the abdomen and pelvis revealed severe distal esophagitis. Patient has been on twice daily PPI, he is feeling better. Patient needs EGD for further evaluation of CT findings. The benefits, risks, and potential complications of EGD with possible biopsies were discussed with the patient and he agrees to proceed. Continue twice a day PPI 30 minutes before meals. Continue Carafate 4 times daily as needed for chest pain.  2. history of heavy alcohol use. Transaminases are normal. Patient does have a low albumin in the absence of any recent weight loss. No evidence for liver disease on recent CT scan. We discussed the adverse effects of heavy alcohol use  3. tobacco abuse. Patient does not plan to quit  4. history of illicit drug use with positive urine drug screen during recent admission. We discussed the adverse effects of illicit drug use.

## 2014-06-04 ENCOUNTER — Telehealth: Payer: Self-pay | Admitting: Nurse Practitioner

## 2014-06-04 NOTE — Progress Notes (Signed)
I agree with the above note, plan 

## 2014-06-05 NOTE — Telephone Encounter (Signed)
On 06-04-2014, called [phone] and got a recording that this number cannot be completed as dialed. Will try again later or call mobile number.

## 2014-06-12 NOTE — Telephone Encounter (Signed)
Called on 06-04-2014 and LM on cell # [phone] that we sent prilosec 40 mg to his pharmacy. We suggest he call his PCP, Ddr. Ripudeep Rai,  to refill the Hydrocodone

## 2014-07-14 ENCOUNTER — Encounter: Payer: Self-pay | Admitting: Gastroenterology

## 2016-12-28 ENCOUNTER — Emergency Department (HOSPITAL_COMMUNITY): Payer: Self-pay

## 2016-12-28 ENCOUNTER — Encounter (HOSPITAL_COMMUNITY): Payer: Self-pay

## 2016-12-28 ENCOUNTER — Emergency Department (HOSPITAL_COMMUNITY)
Admission: EM | Admit: 2016-12-28 | Discharge: 2016-12-28 | Disposition: A | Discharge status: Home / Self Care | Payer: Self-pay | Attending: Emergency Medicine | Admitting: Emergency Medicine

## 2016-12-28 DIAGNOSIS — R7989 Other specified abnormal findings of blood chemistry: Secondary | ICD-10-CM

## 2016-12-28 DIAGNOSIS — R945 Abnormal results of liver function studies: Secondary | ICD-10-CM | POA: Insufficient documentation

## 2016-12-28 DIAGNOSIS — Z79899 Other long term (current) drug therapy: Secondary | ICD-10-CM | POA: Insufficient documentation

## 2016-12-28 DIAGNOSIS — F17210 Nicotine dependence, cigarettes, uncomplicated: Secondary | ICD-10-CM | POA: Insufficient documentation

## 2016-12-28 DIAGNOSIS — R112 Nausea with vomiting, unspecified: Secondary | ICD-10-CM | POA: Insufficient documentation

## 2016-12-28 LAB — URINALYSIS, ROUTINE W REFLEX MICROSCOPIC
Bilirubin Urine: NEGATIVE
Glucose, UA: NEGATIVE mg/dL
Hgb urine dipstick: NEGATIVE
Ketones, ur: 5 mg/dL — AB
Leukocytes, UA: NEGATIVE
Nitrite: NEGATIVE
Protein, ur: NEGATIVE mg/dL
SPECIFIC GRAVITY, URINE: 1.023 (ref 1.005–1.030)
pH: 6 (ref 5.0–8.0)

## 2016-12-28 LAB — LIPASE, BLOOD: LIPASE: 27 U/L (ref 11–51)

## 2016-12-28 LAB — COMPREHENSIVE METABOLIC PANEL
ALT: 148 U/L — ABNORMAL HIGH (ref 17–63)
AST: 198 U/L — ABNORMAL HIGH (ref 15–41)
Albumin: 3.8 g/dL (ref 3.5–5.0)
Alkaline Phosphatase: 85 U/L (ref 38–126)
Anion gap: 12 (ref 5–15)
BUN: 12 mg/dL (ref 6–20)
CHLORIDE: 96 mmol/L — AB (ref 101–111)
CO2: 29 mmol/L (ref 22–32)
Calcium: 9.5 mg/dL (ref 8.9–10.3)
Creatinine, Ser: 1.02 mg/dL (ref 0.61–1.24)
GFR calc non Af Amer: >60 mL/min (ref >60)
Glucose, Bld: 111 mg/dL — ABNORMAL HIGH (ref 65–99)
POTASSIUM: 5.1 mmol/L (ref 3.5–5.1)
Sodium: 137 mmol/L (ref 135–145)
Total Bilirubin: 1.6 mg/dL — ABNORMAL HIGH (ref 0.3–1.2)
Total Protein: 8.3 g/dL — ABNORMAL HIGH (ref 6.5–8.1)

## 2016-12-28 LAB — CBC
HCT: 46.4 % (ref 39.0–52.0)
Hemoglobin: 15.4 g/dL (ref 13.0–17.0)
MCH: 29.6 pg (ref 26.0–34.0)
MCHC: 33.2 g/dL (ref 30.0–36.0)
MCV: 89.1 fL (ref 78.0–100.0)
Platelets: 232 10*3/uL (ref 150–400)
RBC: 5.21 MIL/uL (ref 4.22–5.81)
RDW: 14.8 % (ref 11.5–15.5)
WBC: 11 10*3/uL — ABNORMAL HIGH (ref 4.0–10.5)

## 2016-12-28 MED ORDER — ALUM & MAG HYDROXIDE-SIMETH 200-200-20 MG/5ML PO SUSP
30.0000 mL | Freq: Once | ORAL | Status: AC
  Administered 2016-12-28: 30 mL via ORAL
  Filled 2016-12-28: qty 30

## 2016-12-28 MED ORDER — ONDANSETRON 4 MG PO TBDP
4.0000 mg | ORAL_TABLET | Freq: Once | ORAL | Status: AC
  Administered 2016-12-28: 4 mg via ORAL
  Filled 2016-12-28: qty 1

## 2016-12-28 MED ORDER — ONDANSETRON HCL 4 MG PO TABS
4.0000 mg | ORAL_TABLET | Freq: Three times a day (TID) | ORAL | 0 refills | Status: DC | PRN
Start: 2016-12-28 — End: 2020-12-23
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
## 2016-12-28 NOTE — Discharge Instructions (Signed)
call or return to have any questions, new symptoms, change in symptoms, or symptoms that you  do not understand. ° °

## 2016-12-28 NOTE — ED Triage Notes (Signed)
Pt reports emesis x 4-5 times today and states he is unable to keep anything down. Denies abd pain./diarrhea. States he feels weak, shaky and dehydrated.

## 2016-12-28 NOTE — ED Notes (Signed)
Patient transported to Ultrasound 

## 2018-04-07 ENCOUNTER — Emergency Department (HOSPITAL_COMMUNITY): Payer: No Typology Code available for payment source

## 2018-04-07 ENCOUNTER — Encounter (HOSPITAL_COMMUNITY): Payer: Self-pay

## 2018-04-07 ENCOUNTER — Other Ambulatory Visit: Payer: Self-pay

## 2018-04-07 ENCOUNTER — Emergency Department (HOSPITAL_COMMUNITY)
Admission: EM | Admit: 2018-04-07 | Discharge: 2018-04-07 | Disposition: A | Discharge status: Home / Self Care | Payer: No Typology Code available for payment source | Attending: Emergency Medicine | Admitting: Emergency Medicine

## 2018-04-07 DIAGNOSIS — F17210 Nicotine dependence, cigarettes, uncomplicated: Secondary | ICD-10-CM | POA: Diagnosis not present

## 2018-04-07 DIAGNOSIS — F1910 Other psychoactive substance abuse, uncomplicated: Secondary | ICD-10-CM | POA: Diagnosis not present

## 2018-04-07 DIAGNOSIS — Z79899 Other long term (current) drug therapy: Secondary | ICD-10-CM | POA: Insufficient documentation

## 2018-04-07 DIAGNOSIS — R55 Syncope and collapse: Secondary | ICD-10-CM | POA: Diagnosis present

## 2018-04-07 DIAGNOSIS — Z041 Encounter for examination and observation following transport accident: Secondary | ICD-10-CM | POA: Insufficient documentation

## 2018-04-07 LAB — CBC WITH DIFFERENTIAL/PLATELET
Abs Immature Granulocytes: 0.1 10*3/uL (ref 0.0–0.1)
BASOS ABS: 0.2 10*3/uL — AB (ref 0.0–0.1)
Basophils Relative: 2 %
EOS PCT: 8 %
Eosinophils Absolute: 1 10*3/uL — ABNORMAL HIGH (ref 0.0–0.7)
HEMATOCRIT: 45.9 % (ref 39.0–52.0)
HEMOGLOBIN: 14.6 g/dL (ref 13.0–17.0)
Immature Granulocytes: 0 %
LYMPHS ABS: 3.6 10*3/uL (ref 0.7–4.0)
LYMPHS PCT: 27 %
MCH: 28.7 pg (ref 26.0–34.0)
MCHC: 31.8 g/dL (ref 30.0–36.0)
MCV: 90.4 fL (ref 78.0–100.0)
MONO ABS: 1.5 10*3/uL — AB (ref 0.1–1.0)
MONOS PCT: 11 %
Neutro Abs: 7.1 10*3/uL (ref 1.7–7.7)
Neutrophils Relative %: 52 %
Platelets: 265 10*3/uL (ref 150–400)
RBC: 5.08 MIL/uL (ref 4.22–5.81)
RDW: 13.8 % (ref 11.5–15.5)
WBC: 13.5 10*3/uL — ABNORMAL HIGH (ref 4.0–10.5)

## 2018-04-07 LAB — ETHANOL: Alcohol, Ethyl (B): 42 mg/dL — ABNORMAL HIGH (ref <10)

## 2018-04-07 NOTE — ED Notes (Signed)
Patient transported to CT 
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
## 2019-11-24 ENCOUNTER — Telehealth: Payer: Self-pay | Admitting: Pharmacy Technician

## 2019-11-25 ENCOUNTER — Telehealth: Payer: Self-pay

## 2019-11-25 NOTE — Telephone Encounter (Signed)
COVID-19 Pre-Screening Questions:11/25/19   Do you currently have a fever (>100 F), chills or unexplained body aches? NO  Are you currently experiencing new cough, shortness of breath, sore throat, runny nose? NO  .  Have you recently travelled outside the state of [place] in the last 14 days? NO  .  Have you been in contact with someone that is currently pending confirmation of Covid19 testing or has been confirmed to have the Covid19 virus?  NO  **If the patient answers NO to ALL questions -  advise the patient to please call the clinic before coming to the office should any symptoms develop.

## 2019-11-26 ENCOUNTER — Encounter: Payer: Self-pay | Admitting: Infectious Diseases

## 2019-11-26 NOTE — Telephone Encounter (Signed)
[facility] Patient Advocate Encounter ° °Insurance verification completed.   ° °The patient is uninsured and will need patient assistance for medication. ° °We can complete the application and will need to meet with the patient for signatures and income documentation. ° °Amillya Chavira E. Quinten Allerton, CPhT °Specialty Pharmacy Patient Advocate °[facility] °Phone: [phone] °Fax:  [phone] ° ° °

## 2019-12-22 ENCOUNTER — Telehealth: Payer: Self-pay

## 2019-12-22 NOTE — Telephone Encounter (Signed)
COVID-19 Pre-Screening Questions:12/22/19  Do you currently have a fever (>100 F), chills or unexplained body aches? NO  Are you currently experiencing new cough, shortness of breath, sore throat, runny nose? NO  .  Have you recently travelled outside the state of [place] in the last 14 days? NO .  Have you been in contact with someone that is currently pending confirmation of Covid19 testing or has been confirmed to have the Covid19 virus? NO  **If the patient answers NO to ALL questions -  advise the patient to please call the clinic before coming to the office should any symptoms develop.     

## 2019-12-23 ENCOUNTER — Encounter: Payer: Self-pay | Admitting: Infectious Diseases

## 2019-12-23 ENCOUNTER — Other Ambulatory Visit: Payer: Self-pay

## 2019-12-23 ENCOUNTER — Telehealth: Payer: Self-pay | Admitting: Pharmacy Technician

## 2019-12-23 ENCOUNTER — Emergency Department (HOSPITAL_COMMUNITY)
Admission: EM | Admit: 2019-12-23 | Discharge: 2019-12-23 | Disposition: A | Discharge status: Home / Self Care | Payer: Self-pay | Attending: Emergency Medicine | Admitting: Emergency Medicine

## 2019-12-23 ENCOUNTER — Emergency Department (HOSPITAL_COMMUNITY): Payer: Self-pay

## 2019-12-23 ENCOUNTER — Encounter (HOSPITAL_COMMUNITY): Payer: Self-pay | Admitting: Emergency Medicine

## 2019-12-23 DIAGNOSIS — K449 Diaphragmatic hernia without obstruction or gangrene: Secondary | ICD-10-CM | POA: Insufficient documentation

## 2019-12-23 DIAGNOSIS — R197 Diarrhea, unspecified: Secondary | ICD-10-CM | POA: Insufficient documentation

## 2019-12-23 DIAGNOSIS — F17210 Nicotine dependence, cigarettes, uncomplicated: Secondary | ICD-10-CM | POA: Insufficient documentation

## 2019-12-23 DIAGNOSIS — R10814 Left lower quadrant abdominal tenderness: Secondary | ICD-10-CM | POA: Insufficient documentation

## 2019-12-23 DIAGNOSIS — R112 Nausea with vomiting, unspecified: Secondary | ICD-10-CM | POA: Insufficient documentation

## 2019-12-23 LAB — COMPREHENSIVE METABOLIC PANEL
ALT: 60 U/L — ABNORMAL HIGH (ref 0–44)
AST: 73 U/L — ABNORMAL HIGH (ref 15–41)
Albumin: 3.7 g/dL (ref 3.5–5.0)
Alkaline Phosphatase: 64 U/L (ref 38–126)
Anion gap: 14 (ref 5–15)
BUN: 15 mg/dL (ref 6–20)
CO2: 26 mmol/L (ref 22–32)
Calcium: 9.7 mg/dL (ref 8.9–10.3)
Chloride: 95 mmol/L — ABNORMAL LOW (ref 98–111)
Creatinine, Ser: 1.06 mg/dL (ref 0.61–1.24)
GFR calc Af Amer: >60 mL/min (ref >60)
GFR calc non Af Amer: >60 mL/min (ref >60)
Glucose, Bld: 107 mg/dL — ABNORMAL HIGH (ref 70–99)
Potassium: 3.9 mmol/L (ref 3.5–5.1)
Sodium: 135 mmol/L (ref 135–145)
Total Bilirubin: 0.8 mg/dL (ref 0.3–1.2)
Total Protein: 8.1 g/dL (ref 6.5–8.1)

## 2019-12-23 LAB — CBC
HCT: 49.4 % (ref 39.0–52.0)
Hemoglobin: 15.7 g/dL (ref 13.0–17.0)
MCH: 27.9 pg (ref 26.0–34.0)
MCHC: 31.8 g/dL (ref 30.0–36.0)
MCV: 87.7 fL (ref 80.0–100.0)
Platelets: 308 10*3/uL (ref 150–400)
RBC: 5.63 MIL/uL (ref 4.22–5.81)
RDW: 13.6 % (ref 11.5–15.5)
WBC: 10.3 10*3/uL (ref 4.0–10.5)
nRBC: 0 % (ref 0.0–0.2)

## 2019-12-23 LAB — URINALYSIS, ROUTINE W REFLEX MICROSCOPIC
Bilirubin Urine: NEGATIVE
Glucose, UA: NEGATIVE mg/dL
Hgb urine dipstick: NEGATIVE
Ketones, ur: 20 mg/dL — AB
Leukocytes,Ua: NEGATIVE
Nitrite: NEGATIVE
Protein, ur: NEGATIVE mg/dL
Specific Gravity, Urine: >1.046 — ABNORMAL HIGH (ref 1.005–1.030)
pH: 6 (ref 5.0–8.0)

## 2019-12-23 LAB — LIPASE, BLOOD: Lipase: 45 U/L (ref 11–51)

## 2019-12-23 MED ORDER — ONDANSETRON 4 MG PO TBDP: 4.0000 mg | ORAL_TABLET | Freq: Three times a day (TID) | ORAL | 0 refills | Status: DC | PRN

## 2019-12-23 MED ORDER — ONDANSETRON HCL 4 MG/2ML IJ SOLN
4.0000 mg | Freq: Once | INTRAMUSCULAR | Status: AC
  Administered 2019-12-23: 4 mg via INTRAVENOUS
  Filled 2019-12-23: qty 2

## 2019-12-23 MED ORDER — SODIUM CHLORIDE 0.9 % IV BOLUS
1000.0000 mL | Freq: Once | INTRAVENOUS | Status: AC
  Administered 2019-12-23: 1000 mL via INTRAVENOUS

## 2019-12-23 MED ORDER — SODIUM CHLORIDE 0.9% FLUSH
3.0000 mL | Freq: Once | INTRAVENOUS | Status: AC
  Administered 2019-12-23: 3 mL via INTRAVENOUS

## 2019-12-23 MED ORDER — IOHEXOL 300 MG/ML  SOLN
100.0000 mL | Freq: Once | INTRAMUSCULAR | Status: AC | PRN
  Administered 2019-12-23: 100 mL via INTRAVENOUS

## 2019-12-23 NOTE — ED Notes (Signed)
Patient verbalizes understanding of discharge instructions. Opportunity for questioning and answers were provided. Armband removed by staff, pt discharged from ED ambulatory by self\  

## 2019-12-23 NOTE — Discharge Instructions (Signed)
As we discussed, your work-up today was reassuring.  Your CT scan did show evidence of hiatal hernia which sometimes can contribute to pain and vomiting.  I provided you outpatient follow-up with GI.  Take Zofran for nausea/vomiting.  Make sure you are staying hydrated.  Return the emergency department for any vomiting, fever, worsening abdominal pain, chest pain, difficulty breathing or any other worsening or concerning symptoms.

## 2019-12-23 NOTE — Telephone Encounter (Signed)
[facility] Patient Advocate Encounter ° °Insurance verification completed.   ° °The patient is uninsured and will need patient assistance for medication. ° °We can complete the application and will need to meet with the patient for signatures and income documentation. ° °Tynika Luddy E. Jermain Curt, CPhT °Specialty Pharmacy Patient Advocate °[facility] °Phone: [phone] °Fax:  [phone] ° ° °

## 2019-12-23 NOTE — ED Triage Notes (Signed)
Pt arrives to ED with c/o fo nausea vomiting diarrhea with poor appetite for 1 week. Pt reports also not being able to sleep.
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
## 2019-12-24 ENCOUNTER — Encounter (HOSPITAL_COMMUNITY): Payer: Self-pay

## 2019-12-24 ENCOUNTER — Emergency Department (HOSPITAL_COMMUNITY)
Admission: EM | Admit: 2019-12-24 | Discharge: 2019-12-25 | Disposition: A | Discharge status: Home / Self Care | Payer: Self-pay | Attending: Emergency Medicine | Admitting: Emergency Medicine

## 2019-12-24 ENCOUNTER — Emergency Department (HOSPITAL_COMMUNITY): Payer: Self-pay

## 2019-12-24 ENCOUNTER — Other Ambulatory Visit: Payer: Self-pay

## 2019-12-24 DIAGNOSIS — R112 Nausea with vomiting, unspecified: Secondary | ICD-10-CM | POA: Insufficient documentation

## 2019-12-24 DIAGNOSIS — F17210 Nicotine dependence, cigarettes, uncomplicated: Secondary | ICD-10-CM | POA: Insufficient documentation

## 2019-12-24 DIAGNOSIS — R197 Diarrhea, unspecified: Secondary | ICD-10-CM | POA: Insufficient documentation

## 2019-12-24 DIAGNOSIS — B182 Chronic viral hepatitis C: Secondary | ICD-10-CM | POA: Insufficient documentation

## 2019-12-24 DIAGNOSIS — Z8616 Personal history of COVID-19: Secondary | ICD-10-CM | POA: Insufficient documentation

## 2019-12-24 NOTE — ED Triage Notes (Signed)
Pt returns for n/v/d reports that he is now having blood in his vomit and stool, dark red. Pt also reports CP, and SOB.

## 2019-12-25 ENCOUNTER — Other Ambulatory Visit: Payer: Self-pay

## 2019-12-25 LAB — COMPREHENSIVE METABOLIC PANEL
ALT: 61 U/L — ABNORMAL HIGH (ref 0–44)
AST: 67 U/L — ABNORMAL HIGH (ref 15–41)
Albumin: 3.4 g/dL — ABNORMAL LOW (ref 3.5–5.0)
Alkaline Phosphatase: 58 U/L (ref 38–126)
Anion gap: 14 (ref 5–15)
BUN: 9 mg/dL (ref 6–20)
CO2: 25 mmol/L (ref 22–32)
Calcium: 9.3 mg/dL (ref 8.9–10.3)
Chloride: 95 mmol/L — ABNORMAL LOW (ref 98–111)
Creatinine, Ser: 0.94 mg/dL (ref 0.61–1.24)
GFR calc Af Amer: >60 mL/min (ref >60)
GFR calc non Af Amer: >60 mL/min (ref >60)
Glucose, Bld: 99 mg/dL (ref 70–99)
Potassium: 3.7 mmol/L (ref 3.5–5.1)
Sodium: 134 mmol/L — ABNORMAL LOW (ref 135–145)
Total Bilirubin: 1.1 mg/dL (ref 0.3–1.2)
Total Protein: 7.8 g/dL (ref 6.5–8.1)

## 2019-12-25 LAB — CBC
HCT: 48 % (ref 39.0–52.0)
Hemoglobin: 15.6 g/dL (ref 13.0–17.0)
MCH: 28.2 pg (ref 26.0–34.0)
MCHC: 32.5 g/dL (ref 30.0–36.0)
MCV: 86.8 fL (ref 80.0–100.0)
Platelets: 282 10*3/uL (ref 150–400)
RBC: 5.53 MIL/uL (ref 4.22–5.81)
RDW: 13.2 % (ref 11.5–15.5)
WBC: 12.1 10*3/uL — ABNORMAL HIGH (ref 4.0–10.5)
nRBC: 0 % (ref 0.0–0.2)

## 2019-12-25 LAB — TROPONIN I (HIGH SENSITIVITY)
Troponin I (High Sensitivity): 4 ng/L (ref <18)
Troponin I (High Sensitivity): 4 ng/L (ref <18)

## 2019-12-25 LAB — TYPE AND SCREEN
ABO/RH(D): O POS
Antibody Screen: NEGATIVE

## 2019-12-25 LAB — ABO/RH: ABO/RH(D): O POS

## 2019-12-25 MED ORDER — SODIUM CHLORIDE 0.9 % IV BOLUS
1000.0000 mL | Freq: Once | INTRAVENOUS | Status: AC
  Administered 2019-12-25: 1000 mL via INTRAVENOUS

## 2019-12-25 MED ORDER — FAMOTIDINE IN NACL 20-0.9 MG/50ML-% IV SOLN
20.0000 mg | Freq: Once | INTRAVENOUS | Status: AC
  Administered 2019-12-25: 20 mg via INTRAVENOUS
  Filled 2019-12-25: qty 50

## 2019-12-25 MED ORDER — OMEPRAZOLE 40 MG PO CPDR: 40.0000 mg | DELAYED_RELEASE_CAPSULE | Freq: Two times a day (BID) | ORAL | 0 refills | Status: AC

## 2019-12-25 MED ORDER — ONDANSETRON HCL 4 MG/2ML IJ SOLN
4.0000 mg | Freq: Once | INTRAMUSCULAR | Status: AC
  Administered 2019-12-25: 4 mg via INTRAVENOUS
  Filled 2019-12-25: qty 2
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
## 2019-12-25 NOTE — ED Notes (Signed)
Patient tolerated PO fluids and saltine crackers.

## 2019-12-26 ENCOUNTER — Encounter: Payer: Self-pay | Admitting: Nurse Practitioner

## 2020-01-15 ENCOUNTER — Telehealth: Payer: Self-pay

## 2020-01-15 NOTE — Telephone Encounter (Signed)
COVID-19 Pre-Screening Questions:01/15/20  Do you currently have a fever (>100 F), chills or unexplained body aches?NO   Are you currently experiencing new cough, shortness of breath, sore throat, runny nose? NO .  Have you recently travelled outside the state of [place] in the last 14 days?NO .  Have you been in contact with someone that is currently pending confirmation of Covid19 testing or has been confirmed to have the Covid19 virus?  NO  **If the patient answers NO to ALL questions -  advise the patient to please call the clinic before coming to the office should any symptoms develop.     

## 2020-01-16 ENCOUNTER — Encounter: Payer: Self-pay | Admitting: Infectious Diseases

## 2020-01-19 ENCOUNTER — Ambulatory Visit: Payer: Self-pay | Admitting: Nurse Practitioner

## 2020-08-14 IMAGING — CT CT ABD-PELV W/ CM
2 of 4 series · 16 of 46 positions shown, 18 images · IV contrast (APPLIED)
Comparison: May 06, 2014

CLINICAL DATA: Abdominal pain.

EXAM:
CT ABDOMEN AND PELVIS WITH CONTRAST
TECHNIQUE: Multidetector CT imaging of the abdomen and pelvis was performed
using the standard protocol following bolus administration of
intravenous contrast.
CONTRAST:  100mL OMNIPAQUE IOHEXOL 300 MG/ML  SOLN

[Series 3: abd/ pelvis 5.0 i30f 2 · axial · 0.74mm/px · z∈[-201,+219]mm · 13 of 94 slices shown, 15 images]
[im 5/94  soft-tissue]
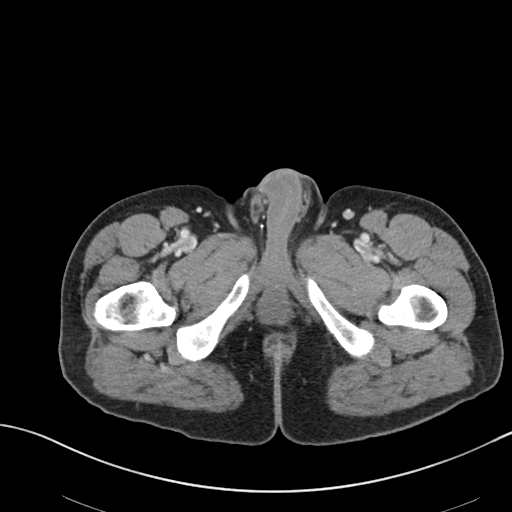
[im 5/94  bone]
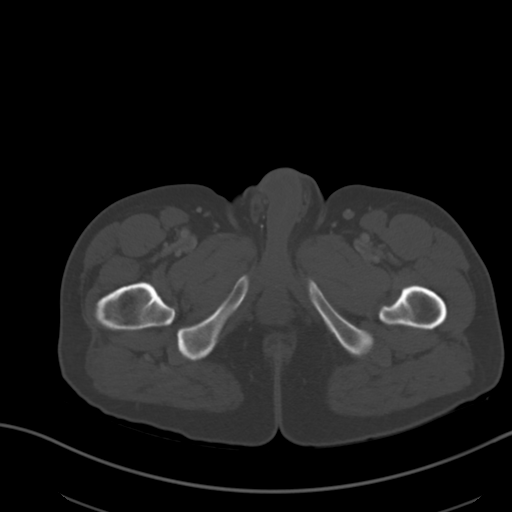
[im 13/94  soft-tissue]
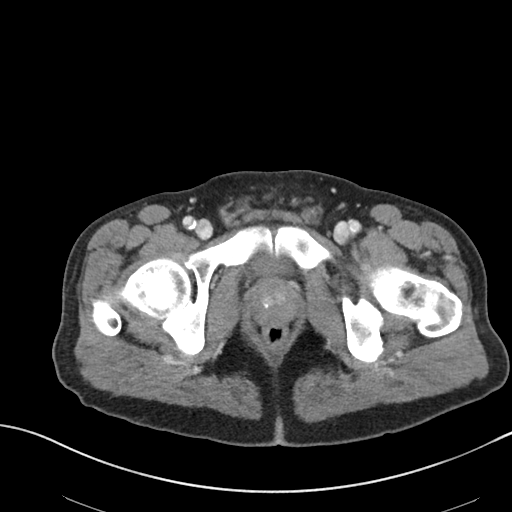
[im 21/94  soft-tissue]
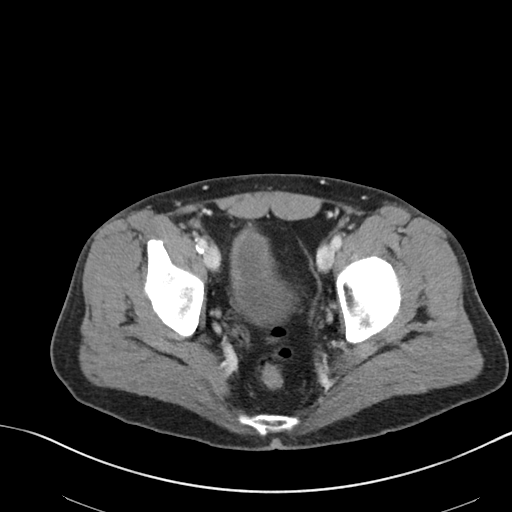
[im 25/94  soft-tissue]
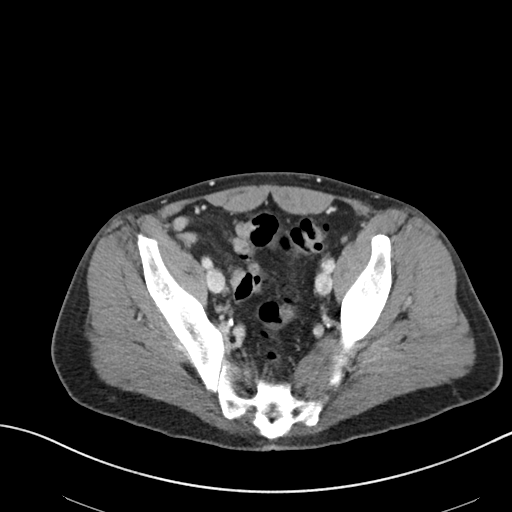
[im 33/94  soft-tissue]
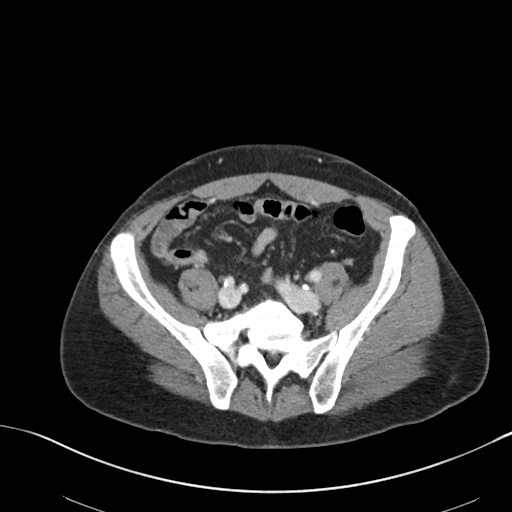
[im 41/94  soft-tissue]
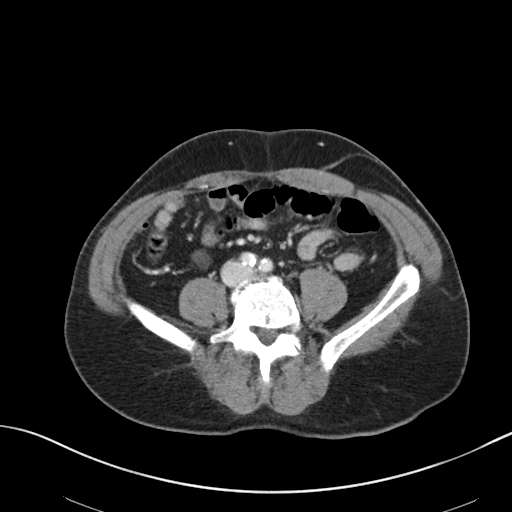
[im 49/94  soft-tissue]
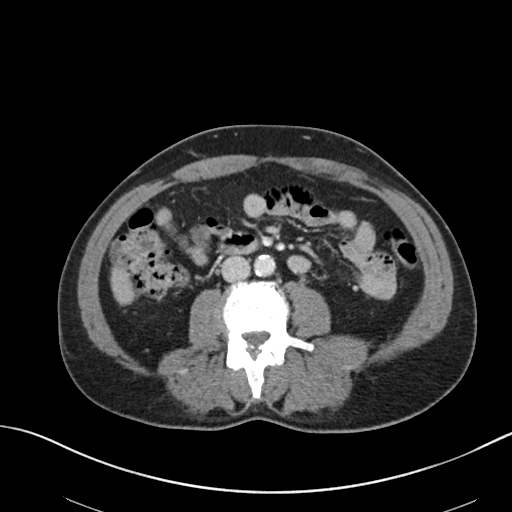
[im 53/94  soft-tissue]
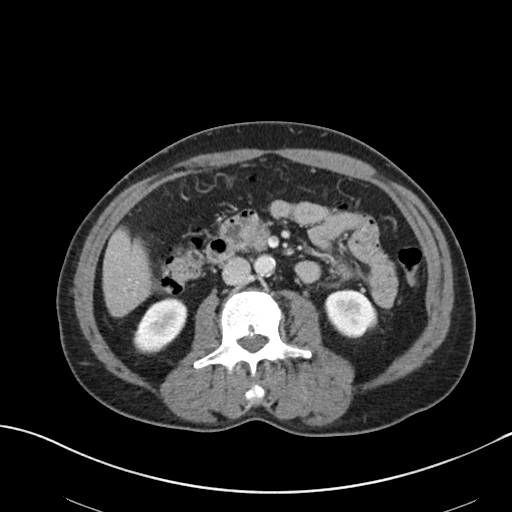
[im 61/94  soft-tissue]
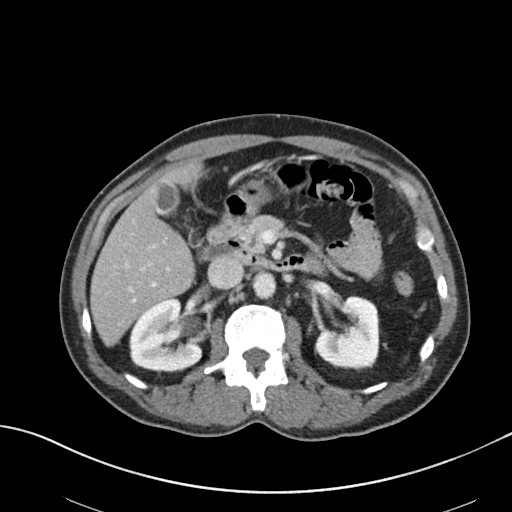
[im 61/94  bone]
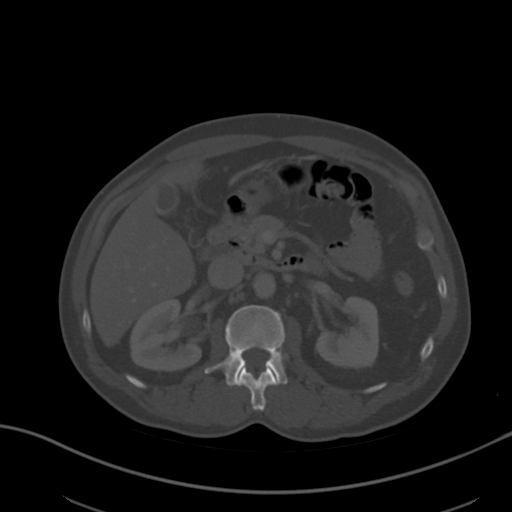
[im 69/94  soft-tissue]
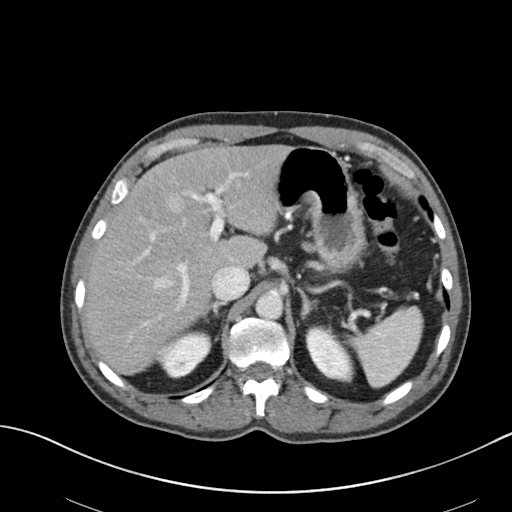
[im 73/94  soft-tissue]
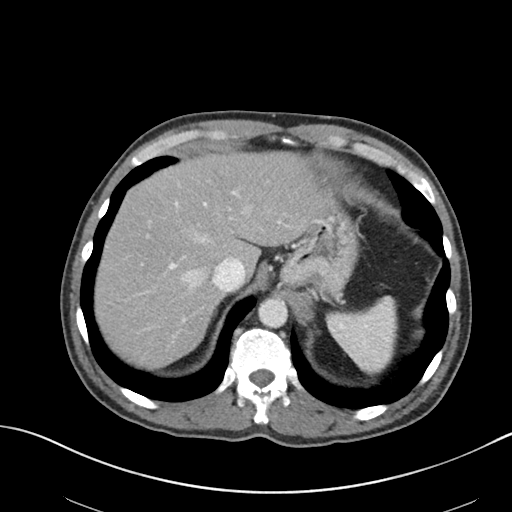
[im 81/94  soft-tissue]
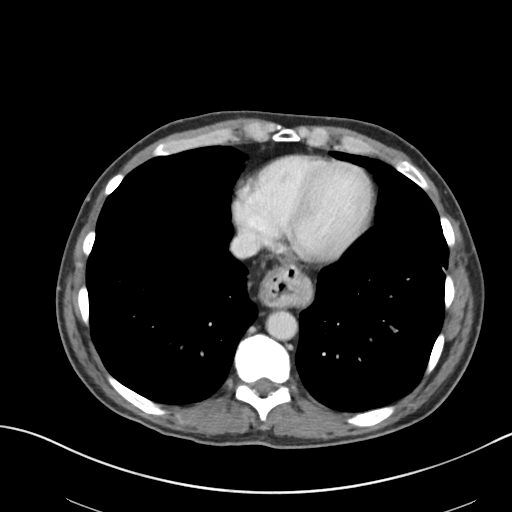
[im 89/94  soft-tissue]
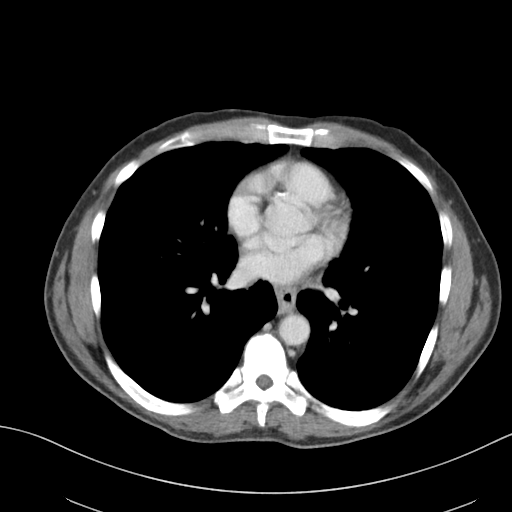

[Series 6: coronal soft tissue · coronal · 0.63mm/px · 3 of 101 slices shown]
[im 34/101  soft-tissue]
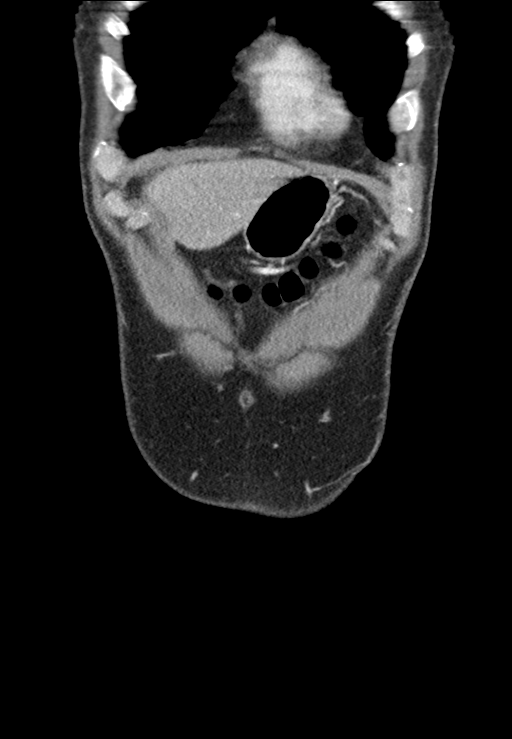
[im 45/101  soft-tissue]
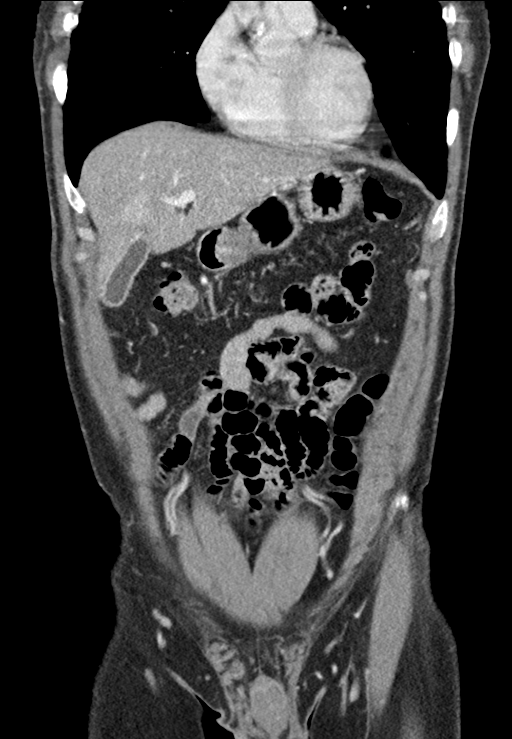
[im 56/101  soft-tissue]
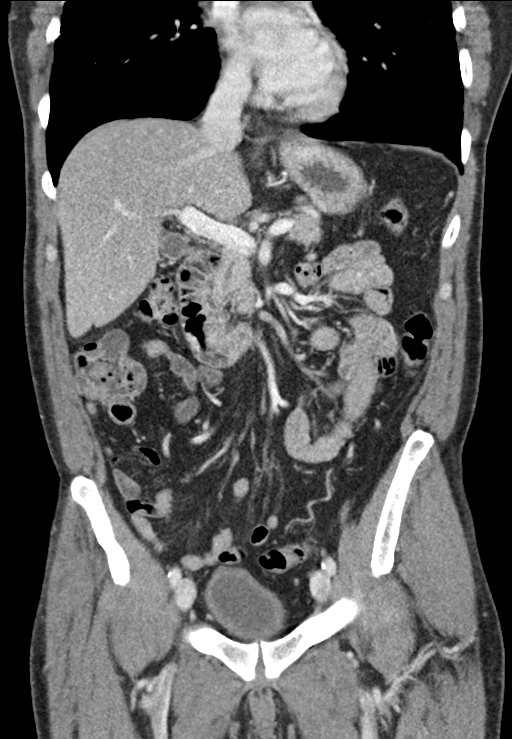

[16 of 46 positions shown; findings below may reference images not displayed]

FINDINGS: Lower chest: No acute abnormality.

Hepatobiliary: There is mild diffuse fatty infiltration of the liver
parenchyma no focal liver abnormality is seen. No gallstones,
gallbladder wall thickening, or biliary dilatation.

Pancreas: Unremarkable. No pancreatic ductal dilatation or
surrounding inflammatory changes.

Spleen: Normal in size without focal abnormality.

Adrenals/Urinary Tract: Adrenal glands are unremarkable. Kidneys are
normal, without renal calculi, focal lesion, or hydronephrosis.
Bladder is unremarkable.

Stomach/Bowel: There is a moderate-sized hiatal hernia. Appendix
appears normal. No evidence of bowel wall thickening, distention, or
inflammatory changes.

Vascular/Lymphatic: Moderate severity aortic calcification. No
enlarged abdominal or pelvic lymph nodes.

Reproductive: The prostate gland is mildly enlarged.

Other: No abdominal wall hernia or abnormality. No abdominopelvic
ascites.

Musculoskeletal: Multilevel degenerative changes seen throughout the
lumbar spine.
IMPRESSION: 1. Mild diffuse fatty infiltration of the liver.
2. Moderate-sized hiatal hernia.
3. Mildly enlarged prostate gland.
4. Multilevel degenerative changes throughout the lumbar spine.
5. Aortic atherosclerosis.

Aortic Atherosclerosis (654KB-57M.M).

## 2020-12-23 ENCOUNTER — Ambulatory Visit: Payer: Self-pay | Admitting: Physician Assistant

## 2020-12-23 ENCOUNTER — Other Ambulatory Visit: Payer: Self-pay

## 2020-12-23 VITALS — BP 133/76 | HR 98 | Temp 98.2°F | Resp 18 | Ht 66.0 in | Wt 132.0 lb

## 2020-12-23 DIAGNOSIS — F1010 Alcohol abuse, uncomplicated: Secondary | ICD-10-CM

## 2020-12-23 DIAGNOSIS — Z720 Tobacco use: Secondary | ICD-10-CM

## 2020-12-23 DIAGNOSIS — G4700 Insomnia, unspecified: Secondary | ICD-10-CM

## 2020-12-23 DIAGNOSIS — F17200 Nicotine dependence, unspecified, uncomplicated: Secondary | ICD-10-CM

## 2020-12-23 MED ORDER — TRAZODONE HCL 50 MG PO TABS: 25.0000 mg | ORAL_TABLET | Freq: Every evening | ORAL | 1 refills | Status: AC | PRN

## 2020-12-23 NOTE — Progress Notes (Signed)
New Patient Office Visit  Subjective:  Patient ID: Aaron Daniel, male    DOB: 06-16-1961  Age: 60 y.o. MRN: [id number]  CC:  Chief Complaint  Patient presents with  . Insomnia    HPI [name] states that he has been having difficulty sleeping, hard time falling asleep and staying asleep, does endorse racing thoughts, has tried benadryl and melatonin without relief.   Reports that he had labs drawn last week at [facility], states he should receive his results next week.  States that he is residing at [facility] until 26th of this month, is being treated for alcohol abuse.  Reports that he feels he is doing very well, states that he does not feel anxious during the day, feels his mood is stable.     Past Medical History:  Diagnosis Date  . Medical history non-contributory     Past Surgical History:  Procedure Laterality Date  . CHEST TUBE INSERTION Left 2014    Family History  Problem Relation Age of Onset  . Hypertension Mother     Social History   Socioeconomic History  . Marital status: Divorced    Spouse name: Not on file  . Number of children: Not on file  . Years of education: Not on file  . Highest education level: Not on file  Occupational History  . Not on file  Tobacco Use  . Smoking status: Current Every Day Smoker    Packs/day: 1.00    Years: 35.00    Pack years: 35.00    Types: Cigarettes  . Smokeless tobacco: Never Used  Substance and Sexual Activity  . Alcohol use: Yes    Alcohol/week: 42.0 standard drinks    Types: 42 Cans of beer per week    Comment: 05/07/2014 "drink 1 6 pack of beer qd"  . Drug use: Yes    Types: Marijuana    Comment: 05/07/2014 "smoke marijuana a couple times/wk"  . Sexual activity: Not Currently  Other Topics Concern  . Not on file  Social History Narrative   As of 04/2014 admission: released from prison in 2009.  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     6 pack beer per day, 12 pack per day on weekends.    Social Determinants of Health   Financial Resource Strain: Not on file  Food Insecurity: Not on file  Transportation Needs: Not on file  Physical Activity: Not on file  Stress: Not on file  Social Connections: Not on file  Intimate Partner Violence: Not on file    ROS Review of Systems  Constitutional: Negative for chills and fever.  HENT: Negative.   Eyes: Negative.   Respiratory: Negative for shortness of breath.   Cardiovascular: Negative for chest pain.  Gastrointestinal: Negative.   Endocrine: Negative.   Genitourinary: Negative.   Musculoskeletal: Negative.   Skin: Negative.   Allergic/Immunologic: Negative.   Neurological: Negative for headaches.  Hematological: Negative.   Psychiatric/Behavioral: Positive for sleep disturbance. Negative for dysphoric mood, self-injury and suicidal ideas. The patient is not nervous/anxious.     Objective:   Today's Vitals: BP 133/76 (BP Location: Left Arm, Patient Position: Sitting, Cuff Size: Normal)   Pulse 98   Temp 98.2 F (36.8 C) (Oral)   Resp 18   Ht 5\' 6"  (1.676 m)   Wt 132 lb (59.9 kg)   SpO2 99%   BMI 21.31 kg/m   Physical  Exam Vitals and nursing note reviewed.  Constitutional:      Appearance: Normal appearance.  HENT:     Head: Normocephalic and atraumatic.     Right Ear: External ear normal.     Left Ear: External ear normal.     Nose: Nose normal.     Mouth/Throat:     Mouth: Mucous membranes are moist.     Pharynx: Oropharynx is clear.  Eyes:     Extraocular Movements: Extraocular movements intact.     Conjunctiva/sclera: Conjunctivae normal.     Pupils: Pupils are equal, round, and reactive to light.  Cardiovascular:     Rate and Rhythm: Normal rate and regular rhythm.     Pulses: Normal pulses.     Heart sounds: Normal heart sounds.  Pulmonary:     Effort: Pulmonary effort is normal.     Breath sounds: Normal breath sounds.   Musculoskeletal:        General: Normal range of motion.     Cervical back: Normal range of motion and neck supple.  Skin:    General: Skin is warm and dry.  Neurological:     General: No focal deficit present.     Mental Status: He is alert and oriented to person, place, and time.  Psychiatric:        Mood and Affect: Mood normal.        Behavior: Behavior normal.        Thought Content: Thought content normal.        Judgment: Judgment normal.     Assessment & Plan:   Problem List Items Addressed This Visit      Other   Tobacco abuse   Alcohol abuse   Insomnia - Primary   Relevant Medications   traZODone (DESYREL) 50 MG tablet      Outpatient Encounter Medications as of 12/23/2020  Medication Sig  . omeprazole (PRILOSEC) 40 MG capsule Take 1 capsule (40 mg total) by mouth 2 (two) times daily.  . traZODone (DESYREL) 50 MG tablet Take 0.5-1 tablets (25-50 mg total) by mouth at bedtime as needed for sleep.  . [DISCONTINUED] HYDROcodone-acetaminophen (NORCO) 7.5-325 MG per tablet Take 1 tablet by mouth every 6 (six) hours as needed for moderate pain. (Patient not taking: Reported on 12/23/2019)  . [DISCONTINUED] ondansetron (ZOFRAN ODT) 4 MG disintegrating tablet Take 1 tablet (4 mg total) by mouth every 8 (eight) hours as needed for nausea or vomiting.  . [DISCONTINUED] ondansetron (ZOFRAN) 4 MG tablet Take 1 tablet (4 mg total) by mouth every 8 (eight) hours as needed for nausea or vomiting. (Patient not taking: Reported on 12/23/2019)  . [DISCONTINUED] oxyCODONE-acetaminophen (ROXICET) 5-325 MG per tablet Take 1 tablet by mouth every 8 (eight) hours as needed for severe pain. (Patient not taking: Reported on 12/23/2019)  . [DISCONTINUED] promethazine (PHENERGAN) 12.5 MG tablet Take 1 tablet (12.5 mg total) by mouth every 6 (six) hours as needed for nausea or vomiting. (Patient not taking: Reported on 12/23/2019)  . [DISCONTINUED] sucralfate (CARAFATE) 1 G tablet Take 1 tablet (1 g  total) by mouth 4 (four) times daily -  with meals and at bedtime. (Patient not taking: Reported on 12/23/2019)   No facility-administered encounter medications on file as of 12/23/2020.  1. Insomnia, unspecified type Trial trazodone, patient education given on good sleep hygiene.  Encouraged patient to return to mobile unit for full physical, patient understands and agrees. - traZODone (DESYREL) 50 MG tablet; Take 0.5-1 tablets (25-50 mg  total) by mouth at bedtime as needed for sleep.  Dispense: 30 tablet; Refill: 1  2. Alcohol abuse   3. Tobacco abuse    I have reviewed the patient's medical history (PMH, PSH, Social History, Family History, Medications, and allergies) , and have been updated if relevant. I spent 31 minutes reviewing chart and  face to face time with patient.      Follow-up: Return if symptoms worsen or fail to improve.   Kasandra Knudsen Mayers, PA-C

## 2020-12-23 NOTE — Patient Instructions (Signed)
I encourage you to try trazodone 25 mg to 50 mg at bedtime to help with your insomnia.   I encourage you to return to the mobile unit at your earliest convenience for a full physical.  We will be able to help you with obtaining financial assistance as well as a primary care provider.  Please let us know if there is anything else we can do for you.  Roney Jaffe, PA-C Physician Assistant [facility] [email or web address]   Insomnia Insomnia is a sleep disorder that makes it difficult to fall asleep or stay asleep. Insomnia can cause fatigue, low energy, difficulty concentrating, mood swings, and poor performance at work or school. There are three different ways to classify insomnia:  Difficulty falling asleep.  Difficulty staying asleep.  Waking up too early in the morning. Any type of insomnia can be long-term (chronic) or short-term (acute). Both are common. Short-term insomnia usually lasts for three months or less. Chronic insomnia occurs at least three times a week for longer than three months. What are the causes? Insomnia may be caused by another condition, situation, or substance, such as:  Anxiety.  Certain medicines.  Gastroesophageal reflux disease (GERD) or other gastrointestinal conditions.  Asthma or other breathing conditions.  Restless legs syndrome, sleep apnea, or other sleep disorders.  Chronic pain.  Menopause.  Stroke.  Abuse of alcohol, tobacco, or illegal drugs.  Mental health conditions, such as depression.  Caffeine.  Neurological disorders, such as Alzheimer's disease.  An overactive thyroid (hyperthyroidism). Sometimes, the cause of insomnia may not be known. What increases the risk? Risk factors for insomnia include:  Gender. Women are affected more often than men.  Age. Insomnia is more common as you get older.  Stress.  Lack of exercise.  Irregular work schedule or  working night shifts.  Traveling between different time zones.  Certain medical and mental health conditions. What are the signs or symptoms? If you have insomnia, the main symptom is having trouble falling asleep or having trouble staying asleep. This may lead to other symptoms, such as:  Feeling fatigued or having low energy.  Feeling nervous about going to sleep.  Not feeling rested in the morning.  Having trouble concentrating.  Feeling irritable, anxious, or depressed. How is this diagnosed? This condition may be diagnosed based on:  Your symptoms and medical history. Your health care provider may ask about: ? Your sleep habits. ? Any medical conditions you have. ? Your mental health.  A physical exam. How is this treated? Treatment for insomnia depends on the cause. Treatment may focus on treating an underlying condition that is causing insomnia. Treatment may also include:  Medicines to help you sleep.  Counseling or therapy.  Lifestyle adjustments to help you sleep better. Follow these instructions at home: Eating and drinking  Limit or avoid alcohol, caffeinated beverages, and cigarettes, especially close to bedtime. These can disrupt your sleep.  Do not eat a large meal or eat spicy foods right before bedtime. This can lead to digestive discomfort that can make it hard for you to sleep.   Sleep habits  Keep a sleep diary to help you and your health care provider figure out what could be causing your insomnia. Write down: ? When you sleep. ? When you wake up during the night. ? How well you sleep. ? How rested you feel the next day. ? Any side effects of medicines you are taking. ? What you eat and drink.  Make your  bedroom a dark, comfortable place where it is easy to fall asleep. ? Put up shades or blackout curtains to block light from outside. ? Use a white noise machine to block noise. ? Keep the temperature cool.  Limit screen use before bedtime.  This includes: ? Watching TV. ? Using your smartphone, tablet, or computer.  Stick to a routine that includes going to bed and waking up at the same times every day and night. This can help you fall asleep faster. Consider making a quiet activity, such as reading, part of your nighttime routine.  Try to avoid taking naps during the day so that you sleep better at night.  Get out of bed if you are still awake after 15 minutes of trying to sleep. Keep the lights down, but try reading or doing a quiet activity. When you feel sleepy, go back to bed.   General instructions  Take over-the-counter and prescription medicines only as told by your health care provider.  Exercise regularly, as told by your health care provider. Avoid exercise starting several hours before bedtime.  Use relaxation techniques to manage stress. Ask your health care provider to suggest some techniques that may work well for you. These may include: ? Breathing exercises. ? Routines to release muscle tension. ? Visualizing peaceful scenes.  Make sure that you drive carefully. Avoid driving if you feel very sleepy.  Keep all follow-up visits as told by your health care provider. This is important. Contact a health care provider if:  You are tired throughout the day.  You have trouble in your daily routine due to sleepiness.  You continue to have sleep problems, or your sleep problems get worse. Get help right away if:  You have serious thoughts about hurting yourself or someone else. If you ever feel like you may hurt yourself or others, or have thoughts about taking your own life, get help right away. You can go to your nearest emergency department or call:  Your local emergency services (911 in the [place]  A suicide crisis helpline, such as the National Suicide Prevention Lifeline at 442-846-6009. This is open 24 hours a day. Summary  Insomnia is a sleep disorder that makes it difficult to fall asleep or  stay asleep.  Insomnia can be long-term (chronic) or short-term (acute).  Treatment for insomnia depends on the cause. Treatment may focus on treating an underlying condition that is causing insomnia.  Keep a sleep diary to help you and your health care provider figure out what could be causing your insomnia. This information is not intended to replace advice given to you by your health care provider. Make sure you discuss any questions you have with your health care provider. Document Revised: 06/10/2020 Document Reviewed: 06/10/2020 Elsevier Patient Education  2021 [employer].

## 2020-12-23 NOTE — Progress Notes (Signed)
Patient reports not being able to sleep through the night since stopping his substance abuse. Patient has eaten today. Patient takes medication at night.

## 2021-01-11 ENCOUNTER — Other Ambulatory Visit: Payer: Self-pay

## 2021-01-11 ENCOUNTER — Ambulatory Visit: Payer: Self-pay | Admitting: Physician Assistant

## 2021-01-11 VITALS — BP 138/89 | HR 78 | Temp 98.2°F | Resp 18 | Ht 66.0 in | Wt 129.0 lb

## 2021-01-11 DIAGNOSIS — F1010 Alcohol abuse, uncomplicated: Secondary | ICD-10-CM

## 2021-01-11 DIAGNOSIS — G4700 Insomnia, unspecified: Secondary | ICD-10-CM

## 2021-01-11 DIAGNOSIS — B360 Pityriasis versicolor: Secondary | ICD-10-CM

## 2021-01-11 DIAGNOSIS — E7800 Pure hypercholesterolemia, unspecified: Secondary | ICD-10-CM

## 2021-01-11 DIAGNOSIS — Z125 Encounter for screening for malignant neoplasm of prostate: Secondary | ICD-10-CM

## 2021-01-11 DIAGNOSIS — Z114 Encounter for screening for human immunodeficiency virus [HIV]: Secondary | ICD-10-CM

## 2021-01-11 DIAGNOSIS — Z13220 Encounter for screening for lipoid disorders: Secondary | ICD-10-CM

## 2021-01-11 DIAGNOSIS — B354 Tinea corporis: Secondary | ICD-10-CM

## 2021-01-11 DIAGNOSIS — Z5309 Procedure and treatment not carried out because of other contraindication: Secondary | ICD-10-CM

## 2021-01-11 DIAGNOSIS — B1920 Unspecified viral hepatitis C without hepatic coma: Secondary | ICD-10-CM

## 2021-01-11 MED ORDER — NYSTATIN 100000 UNIT/GM EX CREA
1.0000 "application " | TOPICAL_CREAM | Freq: Two times a day (BID) | CUTANEOUS | 0 refills | Status: AC
  Filled 2021-01-11: qty 30, 15d supply, fill #0

## 2021-01-11 NOTE — Progress Notes (Signed)
Patient was discharged from [facility] and is needing to set up assistance with a primary. Patient presents for a physical.

## 2021-01-11 NOTE — Progress Notes (Signed)
Established Patient Office Visit  Subjective:  Patient ID: Aaron Daniel, male    DOB: 1961-03-27  Age: 60 y.o. MRN: [id number]  CC:  Chief Complaint  Patient presents with  . Annual Exam    HPI Aaron Daniel reports that he finished his substance abuse program at [facility], states that he feels very positive about his recovery.  Does endorse current increased stressors due to housing situation.  Reports that he did have relief from insomnia with the trazodone.  States that he was taking 50 mg.  Reports that he has not used it since being released from [facility], states that his sleep has been good without it.  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  Reports rash is itchy.  Denies any new detergents, medications, lotions, fragrances, body washes.  Reports that he has tried over-the-counter lotion without relief.  Reports that he was told during his [facility] stay that he was positive for hepatitis C antibody.  Reports this was a new finding for him.  Does endorse previous IV drug use however adamant that it was very limited, also endorses tattoos, and incarceration.  Past Medical History:  Diagnosis Date  . Medical history non-contributory     Past Surgical History:  Procedure Laterality Date  . CHEST TUBE INSERTION Left 2014    Family History  Problem Relation Age of Onset  . Hypertension Mother     Social History   Socioeconomic History  . Marital status: Divorced    Spouse name: Not on file  . Number of children: Not on file  . Years of education: Not on file  . Highest education level: Not on file  Occupational History  . Not on file  Tobacco Use  . Smoking status: Current Every Day Smoker    Packs/day: 1.00    Years: 35.00    Pack years: 35.00    Types: Cigarettes  . Smokeless tobacco: Never Used  Substance and Sexual Activity  . Alcohol use: Yes    Alcohol/week: 42.0  standard drinks    Types: 42 Cans of beer per week    Comment: 05/07/2014 "drink 1 6 pack of beer qd"  . Drug use: Yes    Types: Marijuana    Comment: 05/07/2014 "smoke marijuana a couple times/wk"  . Sexual activity: Not Currently  Other Topics Concern  . Not on file  Social History Narrative   As of 04/2014 admission: released from prison in 2009.  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    6 pack beer per day, 12 pack per day on weekends.    Social Determinants of Health   Financial Resource Strain: Not on file  Food Insecurity: Not on file  Transportation Needs: Not on file  Physical Activity: Not on file  Stress: Not on file  Social Connections: Not on file  Intimate Partner Violence: Not on file    Outpatient Medications Prior to Visit  Medication Sig Dispense Refill  . omeprazole (PRILOSEC) 40 MG capsule Take 1 capsule (40 mg total) by mouth 2 (two) times daily. 60 capsule 0  . traZODone (DESYREL) 50 MG tablet Take 0.5-1 tablets (25-50 mg total) by mouth at bedtime as needed for sleep. 30 tablet 1   No facility-administered medications prior to visit.    Allergies  Allergen  Reactions  . Bee Venom Swelling    ROS Review of Systems  Constitutional: Negative for chills and fever.  HENT: Negative.   Eyes: Negative.   Respiratory: Negative for shortness of breath.   Cardiovascular: Negative for chest pain.  Gastrointestinal: Negative.   Endocrine: Negative.   Genitourinary: Negative.   Musculoskeletal: Negative.   Skin: Positive for rash.  Allergic/Immunologic: Negative.   Neurological: Negative.   Hematological: Negative.   Psychiatric/Behavioral: Negative for dysphoric mood, self-injury, sleep disturbance and suicidal ideas. The patient is not nervous/anxious.       Objective:    Physical Exam Vitals and nursing note reviewed.  Constitutional:      Appearance: Normal appearance.  HENT:      Head: Normocephalic and atraumatic.     Right Ear: External ear normal.     Left Ear: External ear normal.     Nose: Nose normal.     Mouth/Throat:     Mouth: Mucous membranes are moist.     Pharynx: Oropharynx is clear.  Eyes:     Extraocular Movements: Extraocular movements intact.     Conjunctiva/sclera: Conjunctivae normal.     Pupils: Pupils are equal, round, and reactive to light.  Cardiovascular:     Rate and Rhythm: Normal rate and regular rhythm.     Pulses: Normal pulses.     Heart sounds: Normal heart sounds.  Pulmonary:     Effort: Pulmonary effort is normal.     Breath sounds: Normal breath sounds.  Musculoskeletal:        General: Normal range of motion.     Cervical back: Normal range of motion and neck supple.  Skin:    General: Skin is warm.       Neurological:     General: No focal deficit present.     Mental Status: He is alert and oriented to person, place, and time.  Psychiatric:        Mood and Affect: Mood normal.        Behavior: Behavior normal.        Thought Content: Thought content normal.        Judgment: Judgment normal.     BP 138/89 (BP Location: Left Arm, Patient Position: Sitting, Cuff Size: Normal)   Pulse 78   Temp 98.2 F (36.8 C) (Oral)   Resp 18   Ht 5\' 6"  (1.676 m)   Wt 129 lb (58.5 kg)   SpO2 100%   BMI 20.82 kg/m  Wt Readings from Last 3 Encounters:  01/11/21 129 lb (58.5 kg)  12/23/20 132 lb (59.9 kg)  12/25/19 150 lb (68 kg)     Health Maintenance Due  Topic Date Due  . COVID-19 Vaccine (1) Never done  . TETANUS/TDAP  Never done  . COLONOSCOPY (Pts 45-88yrs Insurance coverage will need to be confirmed)  Never done  . Zoster Vaccines- Shingrix (1 of 2) Never done    There are no preventive care reminders to display for this patient.  No results found for: TSH Lab Results  Component Value Date   WBC 12.1 (H) 12/24/2019   HGB 15.6 12/24/2019   HCT 48.0 12/24/2019   MCV 86.8 12/24/2019   PLT 282 12/24/2019    Lab Results  Component Value Date   NA 134 (L) 12/24/2019   K 3.7 12/24/2019   CO2 25 12/24/2019   GLUCOSE 99 12/24/2019   BUN 9 12/24/2019   CREATININE 0.94 12/24/2019   BILITOT 1.1 12/24/2019  ALKPHOS 58 12/24/2019   AST 67 (H) 12/24/2019   ALT 61 (H) 12/24/2019   PROT 7.8 12/24/2019   ALBUMIN 3.4 (L) 12/24/2019   CALCIUM 9.3 12/24/2019   ANIONGAP 14 12/24/2019   No results found for: CHOL No results found for: HDL No results found for: LDLCALC No results found for: TRIG No results found for: CHOLHDL No results found for: ZOXW9U    Assessment & Plan:   Problem List Items Addressed This Visit      Digestive   Hepatitis C virus infection without hepatic coma   Relevant Medications   nystatin cream (MYCOSTATIN)   Other Relevant Orders   CBC with Differential/Platelet   Comp. Metabolic Panel (12)   HCV Ab w Reflex to Quant PCR     Musculoskeletal and Integument   Tinea versicolor   Relevant Medications   nystatin cream (MYCOSTATIN)     Other   Alcohol abuse - Primary   Insomnia    Other Visit Diagnoses    Screening for lipid disorders       Relevant Orders   Lipid panel   Screening PSA (prostate specific antigen)       Relevant Orders   PSA   Screening for HIV without presence of risk factors       Relevant Orders   HIV antibody (with reflex)    1. Alcohol abuse Patient refused referral for counseling, states that he is currently in counseling programs.  Congratulated patient on recovery.  Patient states he does have resources for housing.  2. Insomnia, unspecified type Currently resolved  3. Tinea versicolor Trial Mycostatin, patient education given on over-the-counter treatments as well, preventive strategies - nystatin cream (MYCOSTATIN); Apply 1 application topically 2 (two) times daily.  Dispense: 30 g; Refill: 0  4. Hepatitis C virus infection without hepatic coma, unspecified chronicity Patient was given application packet for [facility] health  financial assistance, patient given appointment to establish care with Dr. Delford Field on March 14, 2021.  Patient education given on hepatitis C infection, will refer patient to infectious disease if appropriate after further testing is completed today. - CBC with Differential/Platelet - Comp. Metabolic Panel (12) - HCV Ab w Reflex to Quant PCR  5. Screening for lipid disorders  - Lipid panel  6. Screening PSA (prostate specific antigen)  - PSA  7. Screening for HIV without presence of risk factors  - HIV antibody (with reflex)   I have reviewed the patient's medical history (PMH, PSH, Social History, Family History, Medications, and allergies) , and have been updated if relevant. I spent 32 minutes reviewing chart and  face to face time with patient.    Meds ordered this encounter  Medications  . nystatin cream (MYCOSTATIN)    Sig: Apply 1 application topically 2 (two) times daily.    Dispense:  30 g    Refill:  0    Order Specific Question:   Supervising Provider    Answer:   Scherry Ran    Follow-up: Return in about 2 months (around 03/14/2021) for [facility].    Kasandra Knudsen Mayers, PA-C

## 2021-01-11 NOTE — Patient Instructions (Signed)
I encourage you to wear sunscreen, a protective hat, especially when you are outside for long periods of time.  I sent a prescription for nystatin cream to the pharmacy for you, I also encourage you to use Selsun shampoo as a body wash twice weekly, and make sure you are staying dry.  We will call you with your lab results.  Roney Jaffe, PA-C Physician Assistant [facility] [email or web address]   Hepatitis C Hepatitis C is a liver infection that is caused by the hepatitis C virus (HCV). The virus infects and causes inflammation in the liver. Hepatitis C can lead to:  A condition where the liver cannot work anymore (liver failure).  Scarring of the liver (cirrhosis).  Liver cancer. People with hepatitis C often do not know for months or years that they have it. This is because they often do not have symptoms or may have only mild symptoms. What are the causes? This condition is caused by HCV. The virus can spread from person to person (is contagious) through:  Contact with an infected person's blood, semen, or vaginal fluids.  Childbirth. A woman who has hepatitis C can pass it to her baby during birth.  Donated blood (blood transfusion) or a donated body organ (organ transplantation) if received in the [place] before 1992. What increases the risk? The following factors may make you more likely to develop this condition:  Having contact with needles or syringes that have HCV on them (are contaminated). Contact may happen while: ? Receiving acupuncture. ? Getting a tattoo. ? Getting a body piercing. ? Injecting drugs.  Having sex with someone who is infected. The virus can spread through vaginal, oral, or anal sex.  Receiving treatment to filter your blood (kidney dialysis).  Having HIV (human immunodeficiency virus) or AIDS (acquired immunodeficiency syndrome).  Having a job that involves contact with blood or  certain other body fluids, such as in health care. What are the signs or symptoms? Symptoms of this condition include:  Tiredness (fatigue).  Loss of appetite.  Nausea or vomiting.  Pain in your abdomen.  Dark yellow urine.  Your skin or the white parts of your eyes turning yellow (jaundice).  Itchy skin.  Light-colored or tan stool.  Joint pain.  Bleeding and bruising that happen often.  Fluid building up in your stomach (ascites). Often, hepatitis C causes no symptoms. How is this diagnosed? This condition is diagnosed with:  Blood tests.  Other tests of how well your liver is working. These may include: ? Magnetic resonance elastography (MRE). This imaging test uses MRI and sound waves to measure liver stiffness. ? Transient elastography. This imaging test uses ultrasound to measure liver stiffness. ? Liver biopsy. This test involves taking a tissue sample from your liver to look at under a microscope. How is this treated? Treatment may depend on how severe your condition is, how long it has lasted, and whether you have liver damage. More testing may be done to figure out the best treatment. Treatment may include:  Taking antiviral medicines and other medicines.  Having follow-up treatments every 6-12 months for infections or other liver problems.  Having liver transplantation. Follow these instructions at home: Medicines  Take over-the-counter and prescription medicines only as told by your health care provider.  If you were prescribed an antiviral medicine, take it as told by your health care provider. Do not stop using the antiviral even if you start to feel better.  Do not take any new  medicines, including over-the-counter medicines or supplements, unless your health care provider approves. Activity  Rest as needed.  Do not have sex unless approved by your health care provider.  Return to your normal activities as told by your health care provider. Ask  your health care provider what activities are safe for you.  Ask your health care provider when you may return to school or work. Eating and drinking  Eat a balanced diet with plenty of fruits and vegetables, whole grains, and lean meats or non-meat proteins (such as beans or tofu).  Drink enough fluids to keep your urine pale yellow.  Do not drink alcohol.   General instructions  Do not share toothbrushes, nail clippers, or razors.  Wash your hands often with soap and water for at least 20 seconds. If soap and water are not available, use hand sanitizer.  Cover any cuts or open sores on your skin to prevent spreading HCV.  Avoid swimming or using hot tubs if you have open sores or wounds.  Keep all follow-up visits as told by your health care provider. This is important. You may need follow-up visits every 6-12 months. How is this prevented? There is no vaccine for hepatitis C. The only way to prevent this infection is to lessen your risk of coming into contact with HCV. Make sure you:  Wash your hands often with soap and water for at least 20 seconds.  Do not share needles or syringes.  Use a condom every time you have vaginal, oral, or anal sex. Be sure to use it correctly each time. ? Both females and males should wear condoms. ? Condoms should be kept in place from the beginning to the end of sexual activity. ? Latex condoms should be used, if possible. These offer the best protection.  Avoid handling blood or other body fluids without gloves or other protection.  Avoid getting tattoos or body piercings in shops or other places that are not clean. Where to find more information  Centers for Disease Control and Prevention: SaveSearches.co.nz Contact a health care provider if you:  Have a fever.  Have pain in your abdomen.  Pass dark urine.  Pass light-colored or tan stool.  Have joint pain. Get help right away if you:  Have an increase in fatigue or  weakness.  Lose your appetite.  Cannot eat or drink without vomiting.  Develop jaundice or your jaundice gets worse.  Bruise or bleed easily. Summary  Hepatitis C is a liver infection that is caused by the hepatitis C virus (HCV). This infection can lead to a condition where the liver cannot work anymore (liver failure), scarring of the liver (cirrhosis), or liver cancer.  HCV causes this condition and can spread from person to person (is contagious).  Do not take any medicines, including over-the-counter medicines or supplements, unless your health care provider approves. This information is not intended to replace advice given to you by your health care provider. Make sure you discuss any questions you have with your health care provider. Document Revised: 04/23/2019 Document Reviewed: 03/31/2019 Elsevier Patient Education  2021 [employer].

## 2021-01-12 DIAGNOSIS — B360 Pityriasis versicolor: Secondary | ICD-10-CM | POA: Insufficient documentation

## 2021-01-12 DIAGNOSIS — B1920 Unspecified viral hepatitis C without hepatic coma: Secondary | ICD-10-CM | POA: Insufficient documentation

## 2021-01-13 ENCOUNTER — Ambulatory Visit (HOSPITAL_COMMUNITY): Payer: Self-pay | Admitting: Behavioral Health

## 2021-01-14 ENCOUNTER — Ambulatory Visit (HOSPITAL_COMMUNITY): Payer: Self-pay | Admitting: Behavioral Health

## 2021-01-14 LAB — CBC WITH DIFFERENTIAL/PLATELET
Basophils Absolute: 0.1 10*3/uL (ref 0.0–0.2)
Basos: 1 %
EOS (ABSOLUTE): 0.8 10*3/uL — ABNORMAL HIGH (ref 0.0–0.4)
Eos: 8 %
Hematocrit: 46.6 % (ref 37.5–51.0)
Hemoglobin: 15.3 g/dL (ref 13.0–17.7)
Immature Grans (Abs): 0 10*3/uL (ref 0.0–0.1)
Immature Granulocytes: 0 %
Lymphocytes Absolute: 2.9 10*3/uL (ref 0.7–3.1)
Lymphs: 26 %
MCH: 29 pg (ref 26.6–33.0)
MCHC: 32.8 g/dL (ref 31.5–35.7)
MCV: 88 fL (ref 79–97)
Monocytes Absolute: 1 10*3/uL — ABNORMAL HIGH (ref 0.1–0.9)
Monocytes: 9 %
Neutrophils Absolute: 6.2 10*3/uL (ref 1.4–7.0)
Neutrophils: 56 %
Platelets: 164 10*3/uL (ref 150–450)
RBC: 5.28 x10E6/uL (ref 4.14–5.80)
RDW: 11.8 % (ref 11.6–15.4)
WBC: 11.2 10*3/uL — ABNORMAL HIGH (ref 3.4–10.8)

## 2021-01-14 LAB — HCV AB W REFLEX TO QUANT PCR: HCV Ab: >11 s/co ratio — ABNORMAL HIGH (ref 0.0–0.9)

## 2021-01-14 LAB — LIPID PANEL
Chol/HDL Ratio: 3.4 ratio (ref 0.0–5.0)
Cholesterol, Total: 183 mg/dL (ref 100–199)
HDL: 54 mg/dL (ref >39)
LDL Chol Calc (NIH): 116 mg/dL — ABNORMAL HIGH (ref 0–99)
Triglycerides: 69 mg/dL (ref 0–149)
VLDL Cholesterol Cal: 13 mg/dL (ref 5–40)

## 2021-01-14 LAB — HCV RT-PCR, QUANT (NON-GRAPH)
HCV log10: 5.725 log10 IU/mL
Hepatitis C Quantitation: 531000 IU/mL

## 2021-01-14 LAB — COMP. METABOLIC PANEL (12)
AST: 64 IU/L — ABNORMAL HIGH (ref 0–40)
Albumin/Globulin Ratio: 1.1 — ABNORMAL LOW (ref 1.2–2.2)
Albumin: 4.3 g/dL (ref 3.8–4.9)
Alkaline Phosphatase: 76 IU/L (ref 44–121)
BUN/Creatinine Ratio: 11 (ref 9–20)
BUN: 9 mg/dL (ref 6–24)
Bilirubin Total: 0.8 mg/dL (ref 0.0–1.2)
Calcium: 9.5 mg/dL (ref 8.7–10.2)
Chloride: 95 mmol/L — ABNORMAL LOW (ref 96–106)
Creatinine, Ser: 0.79 mg/dL (ref 0.76–1.27)
Globulin, Total: 3.9 g/dL (ref 1.5–4.5)
Glucose: 81 mg/dL (ref 65–99)
Potassium: 4.4 mmol/L (ref 3.5–5.2)
Sodium: 133 mmol/L — ABNORMAL LOW (ref 134–144)
Total Protein: 8.2 g/dL (ref 6.0–8.5)
eGFR: 103 mL/min/{1.73_m2} (ref >59)

## 2021-01-14 LAB — PSA: Prostate Specific Ag, Serum: 1.1 ng/mL (ref 0.0–4.0)

## 2021-01-14 LAB — HIV ANTIBODY (ROUTINE TESTING W REFLEX): HIV Screen 4th Generation wRfx: NONREACTIVE

## 2021-01-17 ENCOUNTER — Telehealth: Payer: Self-pay | Admitting: *Deleted

## 2021-01-17 DIAGNOSIS — Z5309 Procedure and treatment not carried out because of other contraindication: Secondary | ICD-10-CM | POA: Insufficient documentation

## 2021-01-17 DIAGNOSIS — E7800 Pure hypercholesterolemia, unspecified: Secondary | ICD-10-CM | POA: Insufficient documentation

## 2021-01-17 NOTE — Telephone Encounter (Signed)
Patient verified DOB Patient is aware of kidneys being normal with chronic liver enzyme elevation being noted related to active hep c. Patient is aware of referral being made to [facility] for Hep C treatment. Patient advised to follow low cholesterol diet due to elevated LDL but not being able to start a medication until after Hep C treatment is completed. Patient wrote down PCP appointment for 8/1.

## 2021-01-17 NOTE — Telephone Encounter (Signed)
-----   Message from Roney Jaffe, [place] sent at 01/17/2021  1:59 PM EDT ----- Please call patient and let him know that his kidney function is within normal limits.  His screening for prostate cancer and HIV were negative.  He does not show signs of anemia.  He does have an elevated liver enzyme, however this appears to be chronic and may be related to his hepatitis C infection.  His lab work did confirm active hepatitis C infection.  Referral started to infectious disease for further evaluation.  His cholesterol is overall within normal limits, however his LDL is elevated.  He does have an elevated risk score of a cardiovascular event in the next 10 years of 12%.  It is recommended that he start medication, however due to his active liver disease, it is a contraindication to start a cholesterol medication at this time.  He needs to continue working on a low-cholesterol diet, and have his cholesterol rechecked after he has completed treatment for hepatitis C.  The 10-year ASCVD risk score Denman George DC Montez Hageman., et al., 2013) is: 12%   Values used to calculate the score:     Age: 60 years     Sex: Male     Is Non-Hispanic African American: No     Diabetic: No     Tobacco smoker: Yes     Systolic Blood Pressure: 138 mmHg     Is BP treated: No     HDL Cholesterol: 54 mg/dL     Total Cholesterol: 183 mg/dL

## 2021-01-17 NOTE — Addendum Note (Signed)
Addended by: Roney Jaffe on: 01/17/2021 02:01 PM   Modules accepted: Orders

## 2021-01-18 ENCOUNTER — Other Ambulatory Visit: Payer: Self-pay

## 2021-01-20 ENCOUNTER — Other Ambulatory Visit: Payer: Self-pay

## 2021-01-20 ENCOUNTER — Ambulatory Visit (HOSPITAL_COMMUNITY): Payer: No Payment, Other | Admitting: Behavioral Health

## 2021-01-20 DIAGNOSIS — F1010 Alcohol abuse, uncomplicated: Secondary | ICD-10-CM

## 2021-01-24 ENCOUNTER — Other Ambulatory Visit: Payer: Self-pay

## 2021-01-24 ENCOUNTER — Ambulatory Visit (INDEPENDENT_AMBULATORY_CARE_PROVIDER_SITE_OTHER): Payer: No Payment, Other | Admitting: Licensed Clinical Social Worker

## 2021-01-24 DIAGNOSIS — F1010 Alcohol abuse, uncomplicated: Secondary | ICD-10-CM | POA: Diagnosis not present

## 2021-01-26 ENCOUNTER — Other Ambulatory Visit (HOSPITAL_COMMUNITY): Payer: Self-pay

## 2021-01-26 ENCOUNTER — Encounter: Payer: Self-pay | Admitting: Family

## 2021-01-26 ENCOUNTER — Telehealth: Payer: Self-pay

## 2021-01-26 NOTE — Telephone Encounter (Signed)
[facility] Patient Advocate Encounter ? ?Insurance verification completed.   ? ?The patient is uninsured and will need patient assistance for medication. ? ?We can complete the application and will need to meet with the patient for signatures and income documentation. ? ?Deserae Jennings, CPhT ?Specialty Pharmacy Patient Advocate ?[facility] ?Phone: [phone] ?Fax:  [phone]  ?

## 2021-02-01 NOTE — Progress Notes (Signed)
    Daily Group Progress Note  Program: CD-IOP    Group Time: 9am-10:30am   Participation Level: Active   Behavioral Response: Appropriate   Type of Therapy: Process Group   Topic: Clinician checked in with group members, assessing for SI/HI/psychosis and overall level of functioning, including cravings or triggers and coping skills used to deal with uncomfortable feelings. Clinician inquired about attendance at community support meetings and connection with sober support system. Clinician and group members discussed what went well and challenges to sobriety. Clinician facilitated group processing of triggers and relapses over the weekend and common struggles in early sobriety.     Group Time: 10:30am-12pm   Participation Level: Active   Behavioral Response: Appropriate and Sharing   Type of Therapy: Psycho-education Group   Topic: Psycho-educational group facilitated by Pharmacist from [facility]. Pharmacist provided information on medications, side effects, and interactions. Clients were provided with time to as questions about medications. Pharmacist provided feedback on medications to help with cravings and encouraged the need for behavioral intervention in addition to medication changes. Group reviewed possible negative side effects of THC vs CBD product use and potential for abuse of substances other than drug of choice. Group discussed ways to advocate for self when meeting with doctors to avoid potentially addictive medications. Clinician inquired a self-care activity to support recovery to be completed prior to next session.   Summary: Client reported a sobriety date of 12/09/20 following 3 weeks at [facility] residential. Client overly engaged with stories but attempted to be supportive of other group members with personal experiences. Client reported 0/10 for anxiety and depression with 10 being worse. Client reports increased support in recovery with  spirituality and accepting responsibility for behaviors. Client was receptive to psycho-education and planned to attend meetings to support recovery.   Family Program: Family present? No   Name of family member(s): NA  UDS collected: No Results: NA  AA/NA attended?: Yes   Sponsor?: no   Harlon Ditty, LCSW

## 2021-02-24 NOTE — Progress Notes (Signed)
Aaron Daniel is a 60 y.o. male client who presents to [facility] for a walk-in appointment. Clt was a No Show for his original intake appointment. Clt reports he was discharged from [facility] "2 weeks ago". Clt reports he is currently homeless and slept in his car last night. Clt reports he is positive for Hep C; however, he has not received treatment for this medical diagnosis. He reports last use of alcohol, crack cocaine, and THC on 12/09/2020. Clt presented with some ambivalence and reports he was unsure if he wanted to enroll in [place]. Clt appeared to be in the Preparation stage of change. Clt is recommended for SAIOP. Clt was informed of the dates of group therapy. Clt denied SI/HI/AVH.        Mamie Nick, Counselor

## 2021-03-14 ENCOUNTER — Ambulatory Visit: Payer: Self-pay | Admitting: Critical Care Medicine

## 2024-06-24 ENCOUNTER — Emergency Department (HOSPITAL_COMMUNITY): Payer: MEDICAID

## 2024-06-24 ENCOUNTER — Emergency Department (HOSPITAL_COMMUNITY)
Admission: EM | Admit: 2024-06-24 | Discharge: 2024-06-25 | Disposition: A | Discharge status: Home / Self Care | Payer: MEDICAID | Attending: Emergency Medicine | Admitting: Emergency Medicine

## 2024-06-24 ENCOUNTER — Other Ambulatory Visit: Payer: Self-pay

## 2024-06-24 ENCOUNTER — Encounter (HOSPITAL_COMMUNITY): Payer: Self-pay

## 2024-06-24 DIAGNOSIS — R1013 Epigastric pain: Secondary | ICD-10-CM | POA: Diagnosis not present

## 2024-06-24 DIAGNOSIS — R079 Chest pain, unspecified: Secondary | ICD-10-CM | POA: Insufficient documentation

## 2024-06-24 DIAGNOSIS — R112 Nausea with vomiting, unspecified: Secondary | ICD-10-CM | POA: Diagnosis not present

## 2024-06-24 DIAGNOSIS — R197 Diarrhea, unspecified: Secondary | ICD-10-CM | POA: Insufficient documentation

## 2024-06-24 DIAGNOSIS — R051 Acute cough: Secondary | ICD-10-CM

## 2024-06-24 DIAGNOSIS — R109 Unspecified abdominal pain: Secondary | ICD-10-CM

## 2024-06-24 DIAGNOSIS — Z720 Tobacco use: Secondary | ICD-10-CM | POA: Insufficient documentation

## 2024-06-24 LAB — BASIC METABOLIC PANEL WITH GFR
Anion gap: 12 (ref 5–15)
BUN: 29 mg/dL — ABNORMAL HIGH (ref 8–23)
CO2: 23 mmol/L (ref 22–32)
Calcium: 8.7 mg/dL — ABNORMAL LOW (ref 8.9–10.3)
Chloride: 99 mmol/L (ref 98–111)
Creatinine, Ser: 1.27 mg/dL — ABNORMAL HIGH (ref 0.61–1.24)
GFR, Estimated: >60 mL/min (ref >60)
Glucose, Bld: 117 mg/dL — ABNORMAL HIGH (ref 70–99)
Potassium: 3.8 mmol/L (ref 3.5–5.1)
Sodium: 134 mmol/L — ABNORMAL LOW (ref 135–145)

## 2024-06-24 LAB — URINALYSIS, ROUTINE W REFLEX MICROSCOPIC
Bilirubin Urine: NEGATIVE
Glucose, UA: NEGATIVE mg/dL
Hgb urine dipstick: NEGATIVE
Ketones, ur: 20 mg/dL — AB
Leukocytes,Ua: NEGATIVE
Nitrite: NEGATIVE
Protein, ur: NEGATIVE mg/dL
Specific Gravity, Urine: 1.043 — ABNORMAL HIGH (ref 1.005–1.030)
pH: 6 (ref 5.0–8.0)

## 2024-06-24 LAB — DIFFERENTIAL
Abs Immature Granulocytes: 0.16 K/uL — ABNORMAL HIGH (ref 0.00–0.07)
Basophils Absolute: 0.1 K/uL (ref 0.0–0.1)
Basophils Relative: 1 %
Eosinophils Absolute: 0.4 K/uL (ref 0.0–0.5)
Eosinophils Relative: 2 %
Immature Granulocytes: 1 %
Lymphocytes Relative: 13 %
Lymphs Abs: 2.8 K/uL (ref 0.7–4.0)
Monocytes Absolute: 1.8 K/uL — ABNORMAL HIGH (ref 0.1–1.0)
Monocytes Relative: 8 %
Neutro Abs: 16.5 K/uL — ABNORMAL HIGH (ref 1.7–7.7)
Neutrophils Relative %: 75 %

## 2024-06-24 LAB — CBC
HCT: 43.4 % (ref 39.0–52.0)
HCT: 43.8 % (ref 39.0–52.0)
Hemoglobin: 14.3 g/dL (ref 13.0–17.0)
Hemoglobin: 14.4 g/dL (ref 13.0–17.0)
MCH: 26.6 pg (ref 26.0–34.0)
MCH: 26.9 pg (ref 26.0–34.0)
MCHC: 32.9 g/dL (ref 30.0–36.0)
MCHC: 32.9 g/dL (ref 30.0–36.0)
MCV: 80.8 fL (ref 80.0–100.0)
MCV: 81.9 fL (ref 80.0–100.0)
Platelets: 291 K/uL (ref 150–400)
Platelets: 301 K/uL (ref 150–400)
RBC: 5.37 MIL/uL (ref 4.22–5.81)
RBC: 5.35 MIL/uL (ref 4.22–5.81)
RDW: 14.2 % (ref 11.5–15.5)
RDW: 14.3 % (ref 11.5–15.5)
WBC: 21.2 K/uL — ABNORMAL HIGH (ref 4.0–10.5)
WBC: 21.7 K/uL — ABNORMAL HIGH (ref 4.0–10.5)
nRBC: 0 % (ref 0.0–0.2)
nRBC: 0 % (ref 0.0–0.2)

## 2024-06-24 LAB — RAPID HIV SCREEN (HIV 1/2 AB+AG)
HIV 1/2 Antibodies: NONREACTIVE
HIV-1 P24 Antigen - HIV24: NONREACTIVE

## 2024-06-24 LAB — HEPATIC FUNCTION PANEL
ALT: 21 U/L (ref 0–44)
AST: 64 U/L — ABNORMAL HIGH (ref 15–41)
Albumin: 3.5 g/dL (ref 3.5–5.0)
Alkaline Phosphatase: 51 U/L (ref 38–126)
Bilirubin, Direct: 0.1 mg/dL (ref 0.0–0.2)
Indirect Bilirubin: 0.7 mg/dL (ref 0.3–0.9)
Total Bilirubin: 0.8 mg/dL (ref 0.0–1.2)
Total Protein: 7.8 g/dL (ref 6.5–8.1)

## 2024-06-24 LAB — LIPASE, BLOOD: Lipase: 35 U/L (ref 11–51)

## 2024-06-24 LAB — RESP PANEL BY RT-PCR (RSV, FLU A&B, COVID)  RVPGX2
Influenza A by PCR: NEGATIVE
Influenza B by PCR: NEGATIVE
Resp Syncytial Virus by PCR: NEGATIVE
SARS Coronavirus 2 by RT PCR: NEGATIVE

## 2024-06-24 LAB — TROPONIN I (HIGH SENSITIVITY)
Troponin I (High Sensitivity): 10 ng/L (ref <18)
Troponin I (High Sensitivity): 9 ng/L (ref <18)

## 2024-06-24 LAB — I-STAT CG4 LACTIC ACID, ED
Lactic Acid, Venous: 1.8 mmol/L (ref 0.5–1.9)
Lactic Acid, Venous: 1 mmol/L (ref 0.5–1.9)

## 2024-06-24 MED ORDER — DOXYCYCLINE HYCLATE 100 MG PO CAPS: 100.0000 mg | ORAL_CAPSULE | Freq: Two times a day (BID) | ORAL | 0 refills | Status: AC

## 2024-06-24 MED ORDER — IOHEXOL 350 MG/ML SOLN
75.0000 mL | Freq: Once | INTRAVENOUS | Status: AC | PRN
  Administered 2024-06-24: 75 mL via INTRAVENOUS

## 2024-06-24 MED ORDER — PANTOPRAZOLE SODIUM 40 MG IV SOLR
40.0000 mg | Freq: Once | INTRAVENOUS | Status: AC
  Administered 2024-06-24: 40 mg via INTRAVENOUS
  Filled 2024-06-24: qty 10

## 2024-06-24 MED ORDER — SODIUM CHLORIDE 0.9 % IV BOLUS
1000.0000 mL | Freq: Once | INTRAVENOUS | Status: AC
  Administered 2024-06-24: 1000 mL via INTRAVENOUS

## 2024-06-24 MED ORDER — DICYCLOMINE HCL 10 MG/ML IM SOLN
20.0000 mg | Freq: Once | INTRAMUSCULAR | Status: AC
  Administered 2024-06-24: 20 mg via INTRAMUSCULAR
  Filled 2024-06-24: qty 2

## 2024-06-24 MED ORDER — ONDANSETRON HCL 4 MG/2ML IJ SOLN
4.0000 mg | Freq: Once | INTRAMUSCULAR | Status: AC
  Administered 2024-06-24: 4 mg via INTRAVENOUS
  Filled 2024-06-24: qty 2

## 2024-06-24 MED ORDER — ALUM & MAG HYDROXIDE-SIMETH 200-200-20 MG/5ML PO SUSP
30.0000 mL | Freq: Once | ORAL | Status: AC
  Administered 2024-06-24: 30 mL via ORAL
  Filled 2024-06-24: qty 30

## 2024-06-24 MED ORDER — PANTOPRAZOLE SODIUM 20 MG PO TBEC: 40.0000 mg | DELAYED_RELEASE_TABLET | Freq: Every day | ORAL | 0 refills | Status: AC

## 2024-06-24 MED ORDER — CEFTRIAXONE SODIUM 500 MG IJ SOLR
500.0000 mg | Freq: Once | INTRAMUSCULAR | Status: AC
  Administered 2024-06-25: 500 mg via INTRAMUSCULAR
  Filled 2024-06-24: qty 500

## 2024-06-24 MED ORDER — ONDANSETRON 4 MG PO TBDP: 4.0000 mg | ORAL_TABLET | Freq: Three times a day (TID) | ORAL | 0 refills | Status: AC | PRN

## 2024-06-24 NOTE — ED Triage Notes (Addendum)
 Pt c/o chest pain, bodyaches, and dizziness x 2 days. Pt has had shortness of breath, nausea and vomiting. Pt states he would also like to be checked for STDs as well, may have had an exposure to chlamydia.

## 2024-06-24 NOTE — ED Provider Notes (Signed)
 Past Medical History:  Diagnosis Date   Medical history non-contributory    Chest pain 8/10, at first thought it was heart burn but doesn' think so.  Is severe waxing and waning. Low center chest. No radiation.  Feeling weak. Has been there for 3 days. Same with eating.  Nausea and vomiting, threw up 3-4 times.  Physical Exam  BP 118/88 (BP Location: Right Arm)   Pulse (!) 103   Temp 98.1 F (36.7 C) (Oral)   Resp (!) 22   Ht 5' 5 (1.651 m)   Wt 68 kg   SpO2 100%   BMI 24.96 kg/m   Physical Exam Epigastric and LUQ tenderness Neg murphy sign  Procedures  Procedures  ED Course / MDM   Clinical Course as of 06/24/24 1759  Tue Jun 24, 2024  1622 Laboratory workup notable for leukocytosis 21.2.  Initial and delta troponin both flat not consistent with ACS.  COVID flu RSV all negative.  LILLETTE Ozell Marine DO, am transitioning care of this patient to the oncoming provider pending CT abdomen pelvis reevaluation and disposition [MP]    Clinical Course User Index [MP] Marine Ozell LABOR, DO   Medical Decision Making Amount and/or Complexity of Data Reviewed Labs: ordered. Radiology: ordered.  Risk OTC drugs. Prescription drug management.   63yo male presents with body aches, dyspnea, nausea, vomiting and concern for chlamydia exposure.   Lipase, hepatic panel, urine and CT pending.   Labs significant for leukocytosis of 78799.  Lipase returned and is within normal limits.  Hepatic function panel shows very mildly elevated AST of 64.  Lactic acid is within normal limits. Troponin negative x2.  CT abdomen pelvis shows no acute intra-abdominal or pelvic process.  Low clinical suspicion for aortic dissection by history, normal bilateral upper and lower extremity pulses, normal CXR.  Doubt PE in setting of tenderness, nausea, vomiting, no hypoxia, no asymmetric leg swelling or risk factors. Do not suspect boerhaaves given cxr, stability in ED, no signs on ct abdomen.    Hs  leukocytosis but no clear signs of bacterial infection, denies hx of IVDU and low risk for bacteremia.    Does not have RUQ tenderness and has negative murphys sign and doubt ct occult cholecystitis or cholelithiasis.    Suspect likely gastritis and viral syndrome with cough, although in setting of leukocytosis and cough will also cover with doxycyline for possible occult pneumonia and empiric treatment in setting of STI exposure. Given rocephin IM as well for empiric sti treatment.  Has been able to tolerate po in the ED.    Given rx for doxycyline, PPI, zofran . Discussed strict return precautions. Patient discharged in stable condition with understanding of reasons to return.          Dreama Longs, MD 06/25/24 1344

## 2024-06-24 NOTE — ED Notes (Signed)
 PO challenge successful.

## 2024-06-24 NOTE — ED Notes (Signed)
 Phlebotomy asked to stick

## 2024-06-24 NOTE — ED Provider Notes (Signed)
 [facility] AT [facility] Provider Note   CSN: [id number] Arrival date & time: 06/24/24  1041     Patient presents with: Chest Pain   Aaron Daniel is a 63 y.o. male.  With a history of tobacco use alcohol  use and hepatitis C who presents to the ED for chest pain.  2 days of substernal chest pain epigastric abdominal pain along with bodyaches nausea vomiting some diarrhea.  Diarrhea was dark in color.  No frank blood.  Denies fevers chills at home.  Also requesting STD testing.  No STD STI symptoms but does have a new sexual partner.  Drinks alcohol  socially but not heavily at this time and no daily alcohol     Chest Pain      Prior to Admission medications   Medication Sig Start Date End Date Taking? Authorizing Provider  nystatin  cream (MYCOSTATIN ) Apply 1 application topically 2 (two) times daily. 01/11/21   Mayers, Cari S, PA-C  omeprazole  (PRILOSEC) 40 MG capsule Take 1 capsule (40 mg total) by mouth 2 (two) times daily. 12/25/19   Vicky Charleston, PA-C  traZODone  (DESYREL ) 50 MG tablet Take 0.5-1 tablets (25-50 mg total) by mouth at bedtime as needed for sleep. 12/23/20   Mayers, Cari S, PA-C    Allergies: Bee venom    Review of Systems  Cardiovascular:  Positive for chest pain.    Updated Vital Signs BP 118/88 (BP Location: Right Arm)   Pulse (!) 103   Temp 98.1 F (36.7 C) (Oral)   Resp (!) 22   Ht 5' 5 (1.651 m)   Wt 68 kg   SpO2 100%   BMI 24.96 kg/m   Physical Exam Vitals and nursing note reviewed.  HENT:     Head: Normocephalic and atraumatic.  Eyes:     Pupils: Pupils are equal, round, and reactive to light.  Cardiovascular:     Rate and Rhythm: Normal rate and regular rhythm.  Pulmonary:     Effort: Pulmonary effort is normal.     Breath sounds: Normal breath sounds.  Abdominal:     Palpations: Abdomen is soft.     Tenderness: There is abdominal tenderness.     Comments: Epigastric tenderness with no rebound  rigidity guarding  Skin:    General: Skin is warm and dry.  Neurological:     Mental Status: He is alert.  Psychiatric:        Mood and Affect: Mood normal.     (all labs ordered are listed, but only abnormal results are displayed) Labs Reviewed  BASIC METABOLIC PANEL WITH GFR - Abnormal; Notable for the following components:      Result Value   Sodium 134 (*)    Glucose, Bld 117 (*)    BUN 29 (*)    Creatinine, Ser 1.27 (*)    Calcium 8.7 (*)    All other components within normal limits  CBC - Abnormal; Notable for the following components:   WBC 21.2 (*)    All other components within normal limits  RESP PANEL BY RT-PCR (RSV, FLU A&B, COVID)  [id number]  CULTURE, BLOOD (ROUTINE X 2)  CULTURE, BLOOD (ROUTINE X 2)  HEPATIC FUNCTION PANEL  LIPASE, BLOOD  RPR  URINALYSIS, ROUTINE W REFLEX MICROSCOPIC  RAPID HIV SCREEN (HIV 1/2 AB+AG)  DIFFERENTIAL  I-STAT CG4 LACTIC ACID, ED  GC/CHLAMYDIA PROBE AMP ([place]) NOT AT [facility]  TROPONIN I (HIGH SENSITIVITY)  TROPONIN I (HIGH SENSITIVITY)  EKG: None  Radiology: CT ABDOMEN PELVIS W CONTRAST Result Date: 06/24/2024 CLINICAL DATA:  Abdominal and epigastric pain, nausea/vomiting/diarrhea EXAM: CT ABDOMEN AND PELVIS WITH CONTRAST TECHNIQUE: Multidetector CT imaging of the abdomen and pelvis was performed using the standard protocol following bolus administration of intravenous contrast. RADIATION DOSE REDUCTION: This exam was performed according to the departmental dose-optimization program which includes automated exposure control, adjustment of the mA and/or kV according to patient size and/or use of iterative reconstruction technique. CONTRAST:  75mL OMNIPAQUE  IOHEXOL  350 MG/ML SOLN COMPARISON:  12/23/2019 FINDINGS: Lower chest: Partial visualization of a known 6 mm right lower lobe pulmonary nodule, unchanged since 2014 consistent with benign etiology. No specific follow-up recommended. No acute pleural or parenchymal lung disease.  Small hiatal hernia. Hepatobiliary: No focal liver abnormality is seen. No gallstones, gallbladder wall thickening, or biliary dilatation. Pancreas: Unremarkable. No pancreatic ductal dilatation or surrounding inflammatory changes. Spleen: Normal in size without focal abnormality. Adrenals/Urinary Tract: No urinary tract calculi or obstructive uropathy within either kidney. The kidneys enhance normally and symmetrically. The adrenals and bladder are unremarkable. Stomach/Bowel: No bowel obstruction or ileus. Normal appendix right lower quadrant. No bowel wall thickening or inflammatory change. Stable hiatal hernia. Vascular/Lymphatic: Atherosclerosis of the aorta and bilateral iliac vessels. 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 No pathologic adenopathy within the abdomen or pelvis. Reproductive: Prostate is unremarkable. Other: No free fluid or free intraperitoneal gas. Small fat containing umbilical hernia. No bowel herniation. Musculoskeletal: No acute or destructive bony abnormalities. Reconstructed images demonstrate no additional findings. IMPRESSION: 1. Stable small hiatal hernia. 2. No acute intra-abdominal or intrapelvic process. 3.  Aortic Atherosclerosis (ICD10-I70.0). Electronically Signed   By: Ozell Daring M.D.   On: 06/24/2024 17:26   DG Chest 2 View Result Date: 06/24/2024 CLINICAL DATA:  Chest pain EXAM: CHEST - 2 VIEW COMPARISON:  12/24/2019 FINDINGS: Similar atelectasis or scarring at the left base. Right lung clear. The cardiopericardial silhouette is within normal limits for size. No acute bony abnormality. IMPRESSION: Similar atelectasis or scarring at the left base. No acute cardiopulmonary findings. Electronically Signed   By: Camellia Candle M.D.   On: 06/24/2024 11:31     Procedures   Medications Ordered in the ED  sodium chloride  0.9 % bolus 1,000 mL (1,000 mLs Intravenous New Bag/Given 06/24/24 1624)  ondansetron  (ZOFRAN ) injection 4 mg (4 mg Intravenous Given  06/24/24 1625)  iohexol  (OMNIPAQUE ) 350 MG/ML injection 75 mL (75 mLs Intravenous Contrast Given 06/24/24 1719)    Clinical Course as of 06/24/24 1729  Tue Jun 24, 2024  1622 Laboratory workup notable for leukocytosis 21.2.  Initial and delta troponin both flat not consistent with ACS.  COVID flu RSV all negative.  LILLETTE Ozell Marine DO, am transitioning care of this patient to the oncoming provider pending CT abdomen pelvis reevaluation and disposition [MP]    Clinical Course User Index [MP] Marine Ozell LABOR, DO                                 Medical Decision Making 63 year old male with history as above presented to the ED for chest pain abdominal pain nausea vomiting diarrhea x 2 days.  Tachycardic tachypneic afebrile normotensive here.  Some epigastric abdominal tenderness.    Differential diagnosis includes: ACS Acute intra-abdominal infectious/inflammatory process such as appendicitis, diverticulitis, pancreatitis and cholecystitis Urinary tract infection Atypical presentation for pneumonia Viral gastroenteritis  Will obtain laboratory workup including  CBC with differential, metabolic panel, lipase and urinalysis along with CT abdomen pelvis to evaluate for intra-abdominal infection  Will obtain STI panel at patient's request  Will provide with IV fluids for rehydration Zofran  for nausea vomiting  Amount and/or Complexity of Data Reviewed Labs: ordered. Radiology: ordered.  Risk Prescription drug management.        Final diagnoses:  Chest pain, unspecified type  Abdominal pain, unspecified abdominal location  Nausea and vomiting, unspecified vomiting type    ED Discharge Orders     None          Pamella Ozell LABOR, DO 06/24/24 1729

## 2024-06-25 LAB — RPR: RPR Ser Ql: NONREACTIVE

## 2024-06-25 MED ORDER — LIDOCAINE HCL (PF) 1 % IJ SOLN
INTRAMUSCULAR | Status: AC
  Administered 2024-06-25: 1 mL
  Filled 2024-06-25: qty 5

## 2024-06-26 LAB — GC/CHLAMYDIA PROBE AMP (~~LOC~~) NOT AT ARMC
Chlamydia: NEGATIVE
Comment: NEGATIVE
Comment: NORMAL
Neisseria Gonorrhea: NEGATIVE

## 2024-06-29 LAB — CULTURE, BLOOD (ROUTINE X 2)
Culture: NO GROWTH
Culture: NO GROWTH
Special Requests: ADEQUATE
